# Patient Record
Sex: Female | Born: 1965 | Race: White | Hispanic: No | Marital: Married | Smoking: Never smoker
Health system: Southern US, Community
[De-identification: ages and names within clinical notes are randomized; demographics above are authoritative.]

## PROBLEM LIST (undated history)

## (undated) DIAGNOSIS — F319 Bipolar disorder, unspecified: Secondary | ICD-10-CM

## (undated) HISTORY — DX: Bipolar disorder, unspecified: F31.9

## (undated) HISTORY — PX: ABLATION: SHX5711

---

## 2004-08-17 ENCOUNTER — Ambulatory Visit: Payer: Self-pay | Admitting: Internal Medicine

## 2004-08-24 ENCOUNTER — Ambulatory Visit: Payer: Self-pay | Admitting: Pulmonary Disease

## 2004-09-04 ENCOUNTER — Ambulatory Visit (HOSPITAL_BASED_OUTPATIENT_CLINIC_OR_DEPARTMENT_OTHER): Admission: RE | Admit: 2004-09-04 | Discharge: 2004-09-04 | Payer: Self-pay | Admitting: Pulmonary Disease

## 2004-09-18 ENCOUNTER — Ambulatory Visit: Payer: Self-pay | Admitting: Pulmonary Disease

## 2004-09-26 ENCOUNTER — Ambulatory Visit: Payer: Self-pay | Admitting: Pulmonary Disease

## 2004-11-07 ENCOUNTER — Ambulatory Visit: Payer: Self-pay | Admitting: Internal Medicine

## 2004-11-09 ENCOUNTER — Ambulatory Visit: Payer: Self-pay | Admitting: Internal Medicine

## 2004-11-14 ENCOUNTER — Ambulatory Visit: Payer: Self-pay | Admitting: Internal Medicine

## 2005-10-05 ENCOUNTER — Ambulatory Visit: Payer: Self-pay | Admitting: Internal Medicine

## 2005-10-09 ENCOUNTER — Ambulatory Visit: Payer: Self-pay | Admitting: Internal Medicine

## 2006-03-13 ENCOUNTER — Ambulatory Visit: Payer: Self-pay | Admitting: *Deleted

## 2006-03-13 ENCOUNTER — Ambulatory Visit: Payer: Self-pay | Admitting: Internal Medicine

## 2008-06-19 ENCOUNTER — Ambulatory Visit: Payer: Self-pay | Admitting: Occupational Medicine

## 2008-06-19 DIAGNOSIS — F329 Major depressive disorder, single episode, unspecified: Secondary | ICD-10-CM | POA: Insufficient documentation

## 2009-12-08 ENCOUNTER — Ambulatory Visit: Payer: Self-pay | Admitting: Obstetrics & Gynecology

## 2009-12-08 LAB — CONVERTED CEMR LAB
Cholesterol: 276 mg/dL — ABNORMAL HIGH (ref 0–200)
Glucose, Bld: 97 mg/dL (ref 70–99)
Hemoglobin: 13.5 g/dL (ref 12.0–15.0)
MCHC: 33.2 g/dL (ref 30.0–36.0)
MCV: 88.9 fL (ref 78.0–100.0)
Platelets: 256 10*3/uL (ref 150–400)
RBC: 4.58 M/uL (ref 3.87–5.11)
RDW: 14 % (ref 11.5–15.5)
TSH: 0.919 microintl units/mL (ref 0.350–4.500)

## 2009-12-09 ENCOUNTER — Encounter: Admission: RE | Admit: 2009-12-09 | Discharge: 2009-12-09 | Payer: Self-pay | Admitting: Obstetrics & Gynecology

## 2009-12-14 ENCOUNTER — Encounter: Admission: RE | Admit: 2009-12-14 | Discharge: 2009-12-14 | Payer: Self-pay | Admitting: Obstetrics & Gynecology

## 2009-12-28 ENCOUNTER — Ambulatory Visit: Payer: Self-pay | Admitting: Obstetrics & Gynecology

## 2010-11-25 ENCOUNTER — Inpatient Hospital Stay (INDEPENDENT_AMBULATORY_CARE_PROVIDER_SITE_OTHER)
Admission: RE | Admit: 2010-11-25 | Discharge: 2010-11-25 | Disposition: A | Discharge status: Home / Self Care | Payer: BLUE CROSS/BLUE SHIELD | Source: Ambulatory Visit | Attending: Emergency Medicine | Admitting: Emergency Medicine

## 2010-11-25 ENCOUNTER — Encounter: Payer: Self-pay | Admitting: Emergency Medicine

## 2010-11-25 DIAGNOSIS — J029 Acute pharyngitis, unspecified: Secondary | ICD-10-CM

## 2010-11-25 LAB — CONVERTED CEMR LAB: Rapid Strep: NEGATIVE

## 2010-11-28 ENCOUNTER — Telehealth (INDEPENDENT_AMBULATORY_CARE_PROVIDER_SITE_OTHER): Payer: Self-pay | Admitting: *Deleted

## 2010-11-29 ENCOUNTER — Encounter: Payer: Self-pay | Admitting: Family Medicine

## 2010-11-29 ENCOUNTER — Ambulatory Visit (HOSPITAL_BASED_OUTPATIENT_CLINIC_OR_DEPARTMENT_OTHER)
Admission: RE | Admit: 2010-11-29 | Discharge: 2010-11-29 | Disposition: A | Discharge status: Home / Self Care | Payer: BLUE CROSS/BLUE SHIELD | Source: Ambulatory Visit | Attending: Family Medicine | Admitting: Family Medicine

## 2010-11-29 ENCOUNTER — Ambulatory Visit (INDEPENDENT_AMBULATORY_CARE_PROVIDER_SITE_OTHER): Payer: BLUE CROSS/BLUE SHIELD | Admitting: Family Medicine

## 2010-11-29 VITALS — BP 132/83 | HR 76 | Temp 98.6°F | Ht 69.0 in | Wt 223.2 lb

## 2010-11-29 DIAGNOSIS — M7730 Calcaneal spur, unspecified foot: Secondary | ICD-10-CM

## 2010-11-29 DIAGNOSIS — M79609 Pain in unspecified limb: Secondary | ICD-10-CM

## 2010-11-29 DIAGNOSIS — M79672 Pain in left foot: Secondary | ICD-10-CM

## 2010-11-29 NOTE — Assessment & Plan Note (Signed)
NAME:  JOAQUINA, NISSEN NO.:  [id number]   MEDICAL RECORD NO.:  [id number]          PATIENT TYPE:  POB   LOCATION:  [facility] at [place]         FACILITY:  [facility]   PHYSICIAN:  Allie Bossier, MD        DATE OF BIRTH:  March 20, 1966   DATE OF SERVICE:  12/28/2009                                  CLINIC NOTE   Ms. Figge is of a 45 year old married white G2, P2, she was seen here on  Dec 08, 2009 for her new patient annual exam.  At that time, she was  complaining of bleeding up to 14 days a month and as well as she now  tells me are very painful periods.  I did routine tests including a Pap  smear and mammogram which came back as normal.  On exam, I felt that her  uterus was approximately 8-week size.  The ultrasound measured the  uterus to be 10.6 x 4.8 x 5.5 cm with no particular fibroids, but a  possibility of adenomyosis.  Her TSH was normal and her hemoglobin was  13.5.  Please note that her lipids were abnormal and I am sending her to  a family practice doctor for further evaluation and treatment of this.  I have discussed with her and her husband treatment options of  dysfunctional uterine bleeding, I started with the most minimally  invasive, which would be hormone-mediated treatments.  I feel that she  is not a good candidate for Depo-Provera or birth control pills due to  her psychiatric condition of bipolar disease.   I do feel that Mirena would be an excellent treatment.  I have expressed  to her that she would certainly have irregular bleeding for the first 3  months.  I have also talked to her about hydrothermal ablation since  this has a 90% success rate.  I have also explained to her that it only  has a 40% amenorrhea rate.  I mentioned hysterectomy as a last choice  option.  I have given her handouts on the HTA and the Mirena and she and  her husband will review these choices and come back when they have made  a decision.      Allie Bossier, MD     MCD/MEDQ  D:  12/28/2009  T:  12/29/2009  Job:  [id number]

## 2010-11-29 NOTE — Patient Instructions (Signed)
Your x-rays were negative for a fracture. Your exam is consistent with a contusion of your 5th metatarsal and peroneal tendinopathy (inflammation/pain in the tendons that turn the foot outward). These should heal up over the next 3-5 weeks without a problem. Aleve 1-2 tabs twice a day with food x 7 days then as needed. Icing 15 minutes at a time 3-4 times a day. If swelling becomes an issue, elevation and using a compression bandage are recommended. If you are having trouble walking, can consider a hard-soled shoe or walking boot. If not improving after 4-5 weeks, would recommend reexamining you and considering physical therapy.

## 2010-11-29 NOTE — Assessment & Plan Note (Signed)
NAME:  Caitlyn Chase, DIRDEN NO.:  [id number]   MEDICAL RECORD NO.:  [id number]          PATIENT TYPE:  POB   LOCATION:  [facility] at [place]         FACILITY:  [facility]   PHYSICIAN:  Allie Bossier, MD        DATE OF BIRTH:  October 23, 1965   DATE OF SERVICE:  12/08/2009                                  CLINIC NOTE   HISTORY:  Caitlyn Chase is a 45 year old married white, gravida 2, para 2,  who comes in here for her new patient annual exam.  She has a 8-1/2-  year-old son and a 32 year old daughter and her main complaint is that  of her periods getting heavier over the last few years.  She says that  at this point, she will bleed up to 14 days a month and then may have  breakthrough bleeding until she has her next period approximately 2  weeks later.  She has been married for 20 years.  She reports a  decreased libido but denies dyspareunia.   PAST MEDICAL HISTORY:  Obesity.  She has bipolar and sees a psychiatric  nurse named, Ellis Savage, at [facility].  She has a  history of kidney stones, then she thinks they were calcium stones.   REVIEW OF SYSTEMS:  She has been married for 20 years.  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   PAST SURGICAL HISTORY:  Wisdom tooth extraction x4 with local.   ALLERGIES:  No latex allergies.  No known drug allergies.   SOCIAL HISTORY:  Negative for tobacco, alcohol, or drug use.   FAMILY HISTORY:  Negative for breast, GYN, and colon malignancies.   MEDICATIONS:  1. Fluoxetine 40-60 mg daily.  2. Lamotrigine 100 mg daily.  3. Seroquel XR 300 mg daily.  4. Ambien 10 mg at night.   REVIEW OF SYSTEMS:  She has not had a Pap smear for more than 10 years,  and she has never had a mammogram.   PHYSICAL EXAMINATION:  GENERAL:  Very pleasant and well-hydrated white  female, in no apparent distress.  VITAL SIGNS:  Height 5 feet 9 inches, weight 228 pounds, blood pressure  128/80, and pulse 99.  HEENT:  Normal.  BREASTS:  Normal bilaterally.  HEART:  Regular rate and rhythm.  LUNGS:  Clear to auscultation bilaterally.  ABDOMEN:  Obese and benign.  No palpable hepatosplenomegaly.  EXTERNAL GENITALIA:  She has a moderate amount of vulvar redness, but  she says there is no itch or discomfort.  She is a redhead of note.  Her  vaginal exam reveals a completely normal-appearing vaginal discharge and  cervix and vaginal vault.  Her uterus is approximately 8-week size,  anteverted and mobile, there are no adnexal masses, and is nontender.   ASSESSMENT AND PLAN:  1. Annual exam.  I have checked Pap smear.  We will schedule      mammogram.  Recommended self-breast and self-vulvar exams.  2. Menorrhagia.  I am checking a CBC, TSH, and GYN ultrasound to      evaluate her uterine enlargement.  3. Obesity.  Recommended weight loss, and I am  checking a fasting      lipid panel and a random blood sugar today.  She will follow up      when the ultrasound results are available.      Allie Bossier, MD     MCD/MEDQ  D:  12/08/2009  T:  12/09/2009  Job:  [id number]

## 2010-11-30 ENCOUNTER — Encounter: Payer: Self-pay | Admitting: Family Medicine

## 2010-11-30 DIAGNOSIS — M79672 Pain in left foot: Secondary | ICD-10-CM | POA: Insufficient documentation

## 2010-11-30 NOTE — Progress Notes (Signed)
  Subjective:    Patient ID: Caitlyn Chase, female    DOB: 1965-12-28, 45 y.o.   MRN: [id number]  HPI  45 yo F here for left foot pain.  Patient states while working at [employer] 3 weeks ago, rolled right ankle and believes had a pallet or something fall onto left foot. Right ankle has improved but pain continuing lateral left foot. No swelling or bruising. Not limping because of pain Not taking any medications for pain. Wearing supportive shoes. No prior pain or injuries to this area.  Past Medical History  Diagnosis Date  . Bipolar affective disorder     No current outpatient prescriptions on file prior to visit.    History reviewed. No pertinent past surgical history.  No Known Allergies  History   Social History  . Marital Status: Married    Spouse Name: N/A    Number of Children: N/A  . Years of Education: N/A   Occupational History  . Not on file.   Social History Main Topics  . Smoking status: Never Smoker   . Smokeless tobacco: Not on file  . Alcohol Use: Not on file  . Drug Use: Not on file  . Sexually Active: Not on file   Other Topics Concern  . Not on file   Social History Narrative  . No narrative on file    Family History  Problem Relation Age of Onset  . Hypertension Mother   . Heart attack Mother   . Diabetes Father     BP 132/83  Pulse 76  Temp(Src) 98.6 F (37 C) (Oral)  Ht 5\' 9"  (1.753 m)  Wt 223 lb 3.2 oz (101.243 kg)  BMI 32.96 kg/m2  Review of Systems See HPI above.    Objective:   Physical Exam Gen: NAD L foot: No gross deformity, swelling, or bruising. Mild TTP throughout 5th MT.  No other foot or ankle TTP. FROM ankle and toes.  Strength 5/5 but pain on resisted external rotation Negative ant drawer and talar tilt. NVI distally     Assessment & Plan:  1. Left foot pain - 2/2 contusion, small element of peroneal tendinopathy.  Offered boot, postop shoe but she declined - x-rays were  negative (would expect to be more symptomatic and to see fracture by this point if there was one) so do not feel it is necessary for her to use boot or postop shoe.  Nsaids, icing prn.  If not improving after 3-5 weeks, advised to return for follow-up.

## 2010-11-30 NOTE — Assessment & Plan Note (Signed)
2/2 contusion, small element of peroneal tendinopathy.  Offered boot, postop shoe but she declined - x-rays were negative (would expect to be more symptomatic and to see fracture by this point if there was one) so do not feel it is necessary for her to use boot or postop shoe.  Nsaids, icing prn.  If not improving after 3-5 weeks, advised to return for follow-up.

## 2010-12-02 NOTE — Procedures (Signed)
NAME:  Caitlyn Chase, Caitlyn Chase NO.:  [id number]   MEDICAL RECORD NO.:  [id number]          PATIENT TYPE:  OUT   LOCATION:  SLEEP CENTER                 FACILITY:  [facility]   PHYSICIAN:  Marcelyn Bruins, M.D. [facility] DATE OF BIRTH:  [birth year]   DATE OF STUDY:  09/04/2004                              NOCTURNAL POLYSOMNOGRAM   REFERRING PHYSICIAN:  Dr. Marcelyn Bruins   INDICATION FOR THE STUDY:  Hypersomnia with sleep apnea. Epworth score is 5.   SLEEP ARCHITECTURE:  The patient had a total sleep time of 360 minutes with  adequate slow wave sleep but very diminished REM. Sleep onset latency was  normal and REM latency was somewhat prolonged.   IMPRESSION:  1.  Mild to moderate obstructive sleep apnea/hypopnea syndrome with a      respiratory disturbance index of 18 events per hour and oxygen      desaturation as low as 81%. Events were clearly worse in the supine      position.  2.  Loud snoring noted throughout the study.  3.  No clinically significant cardiac arrhythmias.  4.  Large numbers of leg jerks with moderate sleep disruption. Clinical      correlation is suggested.      KC/MEDQ  D:  09/20/2004 15:31:38  T:  09/20/2004 21:31:59  Job:  [id number]

## 2011-01-22 ENCOUNTER — Emergency Department (HOSPITAL_COMMUNITY)
Admission: EM | Admit: 2011-01-22 | Discharge: 2011-01-23 | Disposition: A | Discharge status: Other Acute Inpatient Hospital | Payer: BC Managed Care – PPO | Attending: Emergency Medicine | Admitting: Emergency Medicine

## 2011-01-22 ENCOUNTER — Inpatient Hospital Stay (HOSPITAL_COMMUNITY): Admission: RE | Admit: 2011-01-22 | Payer: Self-pay | Source: Ambulatory Visit

## 2011-01-22 DIAGNOSIS — R45851 Suicidal ideations: Secondary | ICD-10-CM | POA: Insufficient documentation

## 2011-01-22 DIAGNOSIS — F3289 Other specified depressive episodes: Secondary | ICD-10-CM | POA: Insufficient documentation

## 2011-01-22 DIAGNOSIS — F329 Major depressive disorder, single episode, unspecified: Secondary | ICD-10-CM | POA: Insufficient documentation

## 2011-01-22 LAB — CBC
HCT: 37.9 % (ref 36.0–46.0)
MCHC: 34.3 g/dL (ref 30.0–36.0)
Platelets: 274 10*3/uL (ref 150–400)
RDW: 13.3 % (ref 11.5–15.5)

## 2011-01-22 LAB — URINALYSIS, ROUTINE W REFLEX MICROSCOPIC
Glucose, UA: NEGATIVE mg/dL
Hgb urine dipstick: NEGATIVE
Ketones, ur: NEGATIVE mg/dL
Leukocytes, UA: NEGATIVE
Protein, ur: NEGATIVE mg/dL
Specific Gravity, Urine: 1.026 (ref 1.005–1.030)
Urobilinogen, UA: 0.2 mg/dL (ref 0.0–1.0)
pH: 6 (ref 5.0–8.0)

## 2011-01-22 LAB — RAPID URINE DRUG SCREEN, HOSP PERFORMED
Amphetamines: POSITIVE — AB
Barbiturates: NOT DETECTED
Benzodiazepines: POSITIVE — AB
Cocaine: NOT DETECTED
Tetrahydrocannabinol: NOT DETECTED

## 2011-01-22 LAB — BASIC METABOLIC PANEL
Calcium: 9.4 mg/dL (ref 8.4–10.5)
Chloride: 99 mEq/L (ref 96–112)
Creatinine, Ser: 0.86 mg/dL (ref 0.50–1.10)
GFR calc non Af Amer: >60 mL/min (ref >60)
Potassium: 4.5 mEq/L (ref 3.5–5.1)
Sodium: 136 mEq/L (ref 135–145)

## 2011-01-22 LAB — DIFFERENTIAL
Basophils Absolute: 0.1 10*3/uL (ref 0.0–0.1)
Eosinophils Absolute: 0.2 10*3/uL (ref 0.0–0.7)
Eosinophils Relative: 2 % (ref 0–5)

## 2011-01-22 LAB — ETHANOL: Alcohol, Ethyl (B): <11 mg/dL (ref 0–11)

## 2011-01-23 ENCOUNTER — Inpatient Hospital Stay (HOSPITAL_COMMUNITY)
Admission: AD | Admit: 2011-01-23 | Discharge: 2011-01-24 | DRG: 430 | Disposition: A | Discharge status: Home / Self Care | Payer: BC Managed Care – PPO | Source: Ambulatory Visit | Attending: Psychiatry | Admitting: Psychiatry

## 2011-01-23 DIAGNOSIS — F3160 Bipolar disorder, current episode mixed, unspecified: Principal | ICD-10-CM

## 2011-01-23 DIAGNOSIS — T426X1A Poisoning by other antiepileptic and sedative-hypnotic drugs, accidental (unintentional), initial encounter: Secondary | ICD-10-CM

## 2011-01-23 LAB — HEPATIC FUNCTION PANEL
Albumin: 4.3 g/dL (ref 3.5–5.2)
Total Protein: 7.5 g/dL (ref 6.0–8.3)

## 2011-01-24 DIAGNOSIS — F3189 Other bipolar disorder: Secondary | ICD-10-CM

## 2011-01-25 NOTE — Assessment & Plan Note (Signed)
NAME:  [name], [name] NO.:  [id number]  MEDICAL RECORD NO.:  [id number]  LOCATION:  0502                          [facility]ACILITY:  [facility]  PHYSICIAN:  Franchot Gallo, MD     DATE OF BIRTH:  March 06, 1966  DATE OF ADMISSION:  01/23/2011 DATE OF DISCHARGE:  01/24/2011                      PSYCHIATRIC ADMISSION ASSESSMENT   CHIEF COMPLAINT:  "I really don't know why I am here."  HISTORY OF PRESENT ILLNESS:  Caitlyn Chase is a 45 year old married white female who was admitted to [facility] last evening under involuntary holding order after she allegedly voiced suicidal ideations.  On interview, the patient denied any thoughts of harming herself or others.  She states that she does have a history of mood swings and has Caitlyn irritability recently.  She reports that last evening, she accidentally took 3 Ambien rather than 1 Ambien and 2 Klonopin, which are Caitlyn prescribed nighttime doses.  She states that after taking the Ambien, she became concerned and looked up the "lethal dose of Ambien" on the internet.  She states she discovered that the 3 Ambien she took would not harm Caitlyn and she went to bed.  She reports that later, Caitlyn Chase noticed the search of the lethal dose of Ambien and attempted to wake Caitlyn up and she was "groggy."  She reports that Caitlyn Chase then brought Caitlyn to [facility] for evaluation, and she was taken to [facility] for evaluation of a possible overdose.  On interview, the patient states that she is seen by a local psychiatrist, Dr. Lance Coon, for Caitlyn bipolar disorder.  She states that Caitlyn medications recently have been "tweaked" and, as a result, she has had Caitlyn mild mood swings and irritability.  She, however, states that she had never, at any point in time, felt like harming herself or others.  Currently, the patient states that she is sleeping reasonably well and reports a good appetite.  She reports Caitlyn episodic mild feelings  of sadness, anhedonia, and depressed mood, but again denies any suicidal or homicidal ideations.  She also denies any past attempts or gestures.  The patient also reports to experiencing Caitlyn "mood swings in the past," but prior to treatment, she would up for days at a time and start multiple projects.  She states that she first received care for Caitlyn symptoms approximately 12 years ago, and Caitlyn symptoms have been under good control since.  The patient denies any past/current auditory or visual hallucinations or delusional thinking.  She denies any use of alcohol, tobacco products, or illicit drugs.  The patient states today that she does not feel she needs to be inpatient and would prefer to leave today.  The patient's Chase was contacted, who also agreed that there were no safety concerns.  The patient will be discharged to outpatient followup and removed from Caitlyn holding order.  PAST MEDICAL HISTORY  CURRENT MEDICATIONS: 1. Lamictal 100 mg p.o. q.a.m. 2. Vyvanse 60 mg p.o. q.a.m. 3. Klonopin 0.5 mg tablets 1 to 2 tablets p.o. every day p.r.n. for     irritability. 4. Ambien CR 12.5 mg p.o. nightly or Ambien 10 mg p.o. nightly for  sleep. 5. Abilify 5 mg p.o. q.a.m. for mood stabilization.  ALLERGIES:  NKDA.  MEDICAL ILLNESSES:  None reported.  PAST OPERATIONS:  None reported.  PAST PSYCHIATRIC HISTORY:  As stated above, the patient sees Dr. Lance Coon, at [facility], and has been treated by Caitlyn for 3 to 4 years.  She denies any past psychiatric hospitalizations but states that she was first seen for psychiatric care when she was 45 years of age.  FAMILY HISTORY:  The patient states that Caitlyn family is adopted, but that she knows Caitlyn Chase and Caitlyn Chase have depression as well as alcohol and drug issues.  SOCIAL HISTORY:  The patient states that she was born and raised in the [place], [place] area and currently lives with Caitlyn  Chase of 20 years and their 2 children, a son 65 years of age who is autistic and a daughter 61 years of age.  The patient completed Caitlyn BA in school and is currently working to be a IT consultant.  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  As stated, the patient denies any use of alcohol, illicit drugs, or tobacco products.  MENTAL STATUS EXAM:  General:  The patient was alert and oriented x3 and was friendly and cooperative with this provider.  Speech was appropriate in terms of rate and volume and no pressuring noted.  Mood appeared mildly depressed.  Affect was slightly constricted.  Thoughts:  The patient adamantly denied any suicidal or homicidal ideations nor does she report any auditory or visual hallucinations or delusional thinking. Judgment and insight both appeared good.  IMPRESSION:   Axis I:  Bipolar I disorder, mixed, currently under fair to good control. Axis II:  Deferred. Axis III:  None reported. Axis IV:  Chronic mental illness. Axis V:  Global assessment of functioning at time of admission approximately 70.  Highest global assessment of functioning in past year also 70.  PLAN: 1. The patient was continued on Caitlyn above psychiatric medications, as     previously prescribed.  No medication changes were made. 2. The patient was released from Caitlyn involuntary holding order and     allowed to sign for voluntary care. 3. The patient was discharged to outpatient followup and is scheduled     to see a therapist at [facility] on February 01, 2011 at     8:30 a.m., as well as Ellis Savage at [facility] on January 31, 2011 at 1 p.m.    ___________________________________ Franchot Gallo, MD    RR/MEDQ  D:  01/24/2011  T:  01/25/2011  Job:  [id number]  Electronically Signed by Franchot Gallo MD on 01/25/2011 12:30:10 PM

## 2011-01-25 NOTE — Discharge Summary (Signed)
NAMECORAL, TIMME NO.:  [id number]  MEDICAL RECORD NO.:  [id number]  LOCATION:  0502                          [facility]ACILITY:  [facility]  PHYSICIAN:  Franchot Gallo, MD     DATE OF BIRTH:  [birth year]  DATE OF ADMISSION:  01/23/2011 DATE OF DISCHARGE:  01/24/2011                              DISCHARGE SUMMARY   SUMMARY OF WHY ADMITTED:  The patient was admitted to [facility] after the patient accidentally ingested 3 Ambien instead of 1 Ambien and 2 Klonopin at bedtime as prescribed.  The patient states that this was a mistake and she became concerned and checked on the internet for the lethal dose of Ambien.  When she found that the medication was not harmful to her at the dose taken, she states she went to sleep, but her husband later attempted to wake her up after seeing the search online. She states that she was groggy and the patient's husband became concerned that she may have overdosed and brought her to [facility] for evaluation.  The patient was then sent to [facility] under an involuntary commitment order.  SUMMARY OF HOSPITALIZATION:  Upon interview, the patient adamantly denied any thoughts of harming herself or others.  She stated that she had accidentally taking the medication as reported above, but at no time was considering harming herself or others.  The patient states that she has no history of any suicide attempts or gestures, but did have a history of a bipolar disorder, which was under fair-to-good control. The patient asked to continue on her medications as prescribed by her outpatient provider and requested to be discharged.  On interview, the patient reported some mild feelings of sadness, anhedonia, and depressed mood, but stated that she was sleeping well and reported a good appetite.  She adamantly denied any suicidal or homicidal ideations.  She also denied any auditory or visual hallucinations or delusional thinking or any  recent prolonged manic or hypomanic symptoms.  The patient's husband was also contacted and he did not have any safety concerns.  He reiterated the history given that the patient had accidentally taking 3 Ambien, but he did not feel that she was a danger to self or others.  The patient was discharged per her request.  SIGNIFICANT LABORATORIES:  Urine drug screen on January 22, 2011 was positive for amphetamines and benzodiazepines.  Of note, the patient is taking Vyvanse, which is form of Adderall, as well as Klonopin.  The patient's glucose level was 124.  The patient's WBC count was 10.9. Urine pregnancy was negative.  DISCHARGE MEDICATIONS:   (These are medications the patient reports that she is taking.) 1. Lamictal 100 mg p.o. every a.m. 2. Vyvanse 60 mg p.o. every a.m. 3. Klonopin 0.5 mg tablets 1 to 2 tablets p.o. daily p.r.n. for     irritability. 4. Abilify 5 mg p.o. every a.m. 5. Ambien CR 12.5 mg p.o. at bedtime or Ambien 10 mg p.o. at bedtime     for sleep.  DISCHARGE DIAGNOSES:   Axis I:  Bipolar I disorder, mixed, currently under fair-to-good control. Axis II:  Deferred. Axis III:  No acute illnesses reported.  Axis IV:  Chronic serious mental illness. Axis V:  Global assessment of functioning at time of admission approximately 55.  Global assessment of functioning at time of discharge approximately 60.  Highest global assessment of functioning in past year approximately 65.  CONDITION ON DISCHARGE:  The patient was alert and oriented x3 and friendly and cooperative with this provider.  Speech was appropriate in terms of rate and volume and no pressuring noted.  Mood appeared mildly depressed.  Affect was mildly constricted.  Thoughts, the patient adamantly denied any suicidal or homicidal ideations.  She also denied any auditory or visual hallucinations or delusional thinking.  Judgment and insight both appeared good.  The patient's anxiety was under fair to good  control and there were no medication-related side effects.  FOLLOWUP INSTRUCTIONS:  The patient is to followup with [facility] the her provider, Caprice Kluver, nurse practitioner, on January 31, 2011, 1:00 p.m.  She is also to followup with [facility] on February 01, 2011 and 8:30 a.m. to meet with "Tasia Catchings."    __________________________________ Franchot Gallo, MD     RR/MEDQ  D:  01/24/2011  T:  01/25/2011  Job:  [id number]  Electronically Signed by Franchot Gallo MD on 01/25/2011 12:28:48 PM

## 2011-02-01 ENCOUNTER — Ambulatory Visit (HOSPITAL_COMMUNITY): Payer: BLUE CROSS/BLUE SHIELD | Admitting: Behavioral Health

## 2011-06-19 NOTE — Progress Notes (Signed)
Summary: TEST FOR STREP THROAT rm 4   Vital Signs:  Patient Profile:   45 Years Old Female CC:      hoarseness x 1wk Height:     69 inches Weight:      223.75 pounds O2 Sat:      96 % O2 treatment:    Room Air Temp:     98.2 degrees F oral Pulse rate:   84 / minute Resp:     18 per minute BP sitting:   131 / 85  (left arm) Cuff size:   regular  Vitals Entered By: Clemens Catholic LPN (Nov 25, 2010 12:04 PM)                  Updated Prior Medication List: SEROQUEL XR 300 MG XR24H-TAB (QUETIAPINE FUMARATE) 1 tab by mouth qd AMBIEN 5 MG TABS (ZOLPIDEM TARTRATE)  ABILIFY 5 MG TABS (ARIPIPRAZOLE)  LAMICTAL 150 MG TABS (LAMOTRIGINE)   Current Allergies (reviewed today): No known allergies History of Present Illness History from: patient Chief Complaint: hoarseness x 1wk History of Present Illness: 45 Years Old Female complains of onset of cold symptoms for a few days.  Caitlyn Chase has been using no OTC meds.  She is just here to get tested for strep throat because both her kids are + and are being treated currently. + mild sore throat + mild hoarseness No cough No pleuritic pain No wheezing No nasal congestion + post-nasal drainage No sinus pain/pressure No chest congestion No itchy/red eyes No earache No hemoptysis No SOB No chills/sweats No fever No nausea No vomiting No abdominal pain No diarrhea No skin rashes No fatigue No myalgias No headache   REVIEW OF SYSTEMS Constitutional Symptoms      Denies fever, chills, night sweats, weight loss, weight gain, and fatigue.  Eyes       Denies change in vision, eye pain, eye discharge, glasses, contact lenses, and eye surgery. Ear/Nose/Throat/Mouth       Complains of hoarseness.      Denies hearing loss/aids, change in hearing, ear pain, ear discharge, dizziness, frequent runny nose, frequent nose bleeds, sinus problems, sore throat, and tooth pain or bleeding.  Respiratory       Denies dry cough, productive cough,  wheezing, shortness of breath, asthma, bronchitis, and emphysema/COPD.  Cardiovascular       Denies murmurs, chest pain, and tires easily with exhertion.    Gastrointestinal       Denies stomach pain, nausea/vomiting, diarrhea, constipation, blood in bowel movements, and indigestion. Genitourniary       Denies painful urination, kidney stones, and loss of urinary control. Neurological       Denies paralysis, seizures, and fainting/blackouts. Musculoskeletal       Denies muscle pain, joint pain, joint stiffness, decreased range of motion, redness, swelling, muscle weakness, and gout.  Skin       Denies bruising, unusual mles/lumps or sores, and hair/skin or nail changes.  Psych       Denies mood changes, temper/anger issues, anxiety/stress, speech problems, depression, and sleep problems. Other Comments: pt c/o hoarseness x 1wk. both of her children have tested positive for strep this wk. she does not have a sore throat but she request to be tested for strep.  no fever. she has taken IBF.   Past History:  Past Medical History: Reviewed history from 06/19/2008 and no changes required. Depression BiPolar  Past Surgical History: wisdom teeth extracted  Family History: father-Family History Diabetes 1st degree  relative/pancreatic CA mother- Family History Hypertension  Social History: Never Smoked Alcohol use-no Drug use-no Smoking Status:  never Drug Use:  no Physical Exam General appearance: well developed, well nourished, no acute distress Ears: normal, no lesions or deformities Nasal: mucosa pink, nonedematous, no septal deviation, turbinates normal Oral/Pharynx: mild clear PND, no erythema, no exudates, no swelling Chest/Lungs: no rales, wheezes, or rhonchi bilateral, breath sounds equal without effort Heart: regular rate and  rhythm, no murmur MSE: oriented to time, place, and person Assessment New Problems: ACUTE PHARYNGITIS (ICD-462)   Plan New Orders: Est.  Patient Level II [78469] T-Culture, Throat [62952-84132] Rapid Strep [44010] Planning Comments:   1)  No antibiotic given since rapid strep negative.  Culture is pending. 2)  Use nasal saline solution (over the counter) at least 3 times a day. 3)  Use over the counter decongestants like Zyrtec-D every 12 hours as needed to help with congestion. 4)  Can take tylenol every 6 hours or motrin every 8 hours for pain or fever. 5)  Follow up with your primary doctor  if no improvement in 5-7 days, sooner if increasing pain, fever, or new symptoms.    The patient and/or caregiver has been counseled thoroughly with regard to medications prescribed including dosage, schedule, interactions, rationale for use, and possible side effects and they verbalize understanding.  Diagnoses and expected course of recovery discussed and will return if not improved as expected or if the condition worsens. Patient and/or caregiver verbalized understanding.   Orders Added: 1)  Est. Patient Level II [27253] 2)  T-Culture, Throat [66440-34742] 3)  Rapid Strep [59563]    Laboratory Results  Date/Time Received: Nov 25, 2010 12:09 PM Date/Time Reported: Nov 25, 2010 12:09 PM   Other Tests  Rapid Strep: negative  Kit Test Internal QC: Negative   (Normal Range: Negative)

## 2011-06-19 NOTE — Telephone Encounter (Signed)
  Phone Note Outgoing Call   Call placed by: Clemens Catholic LPN,  Nov 28, 2010 9:29 AM Call placed to: Patient Summary of Call: call back: left message with culture results and to call back if she has any questions or concerns. Initial call taken by: Clemens Catholic LPN,  Nov 28, 2010 9:29 AM

## 2013-05-07 DIAGNOSIS — G4700 Insomnia, unspecified: Secondary | ICD-10-CM | POA: Insufficient documentation

## 2013-05-07 DIAGNOSIS — F319 Bipolar disorder, unspecified: Secondary | ICD-10-CM | POA: Insufficient documentation

## 2013-05-07 DIAGNOSIS — F429 Obsessive-compulsive disorder, unspecified: Secondary | ICD-10-CM | POA: Insufficient documentation

## 2013-05-07 DIAGNOSIS — F988 Other specified behavioral and emotional disorders with onset usually occurring in childhood and adolescence: Secondary | ICD-10-CM | POA: Insufficient documentation

## 2014-03-03 ENCOUNTER — Emergency Department (INDEPENDENT_AMBULATORY_CARE_PROVIDER_SITE_OTHER)
Admission: EM | Admit: 2014-03-03 | Discharge: 2014-03-03 | Disposition: A | Discharge status: Home / Self Care | Payer: 59 | Source: Home / Self Care | Attending: Emergency Medicine | Admitting: Emergency Medicine

## 2014-03-03 ENCOUNTER — Encounter: Payer: Self-pay | Admitting: Emergency Medicine

## 2014-03-03 DIAGNOSIS — L237 Allergic contact dermatitis due to plants, except food: Secondary | ICD-10-CM

## 2014-03-03 DIAGNOSIS — L255 Unspecified contact dermatitis due to plants, except food: Secondary | ICD-10-CM

## 2014-03-03 MED ORDER — PREDNISONE (PAK) 10 MG PO TABS: ORAL_TABLET | ORAL | Status: DC

## 2014-03-03 MED ORDER — BETAMETHASONE DIPROPIONATE 0.05 % EX CREA: TOPICAL_CREAM | Freq: Two times a day (BID) | CUTANEOUS | Status: DC

## 2014-03-03 NOTE — ED Provider Notes (Signed)
CSN: [id number]     Arrival date & time 03/03/14  1817 History   First MD Initiated Contact with Patient 03/03/14 1820     Chief Complaint  Patient presents with  . Rash   (Consider location/radiation/quality/duration/timing/severity/associated sxs/prior Treatment) HPI Pt c/o rash on her arms and legs bilaterally x 1 wk. Was exposed to poison ivy working in the yard the day before the rash started, now it's progressively worsening. Pruritic. Some vesicles have clear drainage at times No bleeding or pus or discolored drainage. No fever or chills or systemic symptoms Past Medical History  Diagnosis Date  . Bipolar affective disorder    History reviewed. No pertinent past surgical history. Family History  Problem Relation Age of Onset  . Hypertension Mother   . Heart attack Mother   . Diabetes Father   . Cancer Father     pancreatic   History  Substance Use Topics  . Smoking status: Never Smoker   . Smokeless tobacco: Not on file  . Alcohol Use: No   OB History   Grav Para Term Preterm Abortions TAB SAB Ect Mult Living                 Review of Systems  All other systems reviewed and are negative.   Allergies  Review of patient's allergies indicates no known allergies.  Home Medications   Prior to Admission medications   Medication Sig Start Date End Date Taking? Authorizing Provider  amphetamine-dextroamphetamine (ADDERALL XR) 20 MG 24 hr capsule Take 20 mg by mouth daily.   Yes Historical Provider, MD  LamoTRIgine (LAMICTAL XR) 250 MG TB24 Take 1 tablet by mouth daily.     Yes Historical Provider, MD  QUEtiapine (SEROQUEL XR) 200 MG 24 hr tablet Take 200 mg by mouth at bedtime.     Yes Historical Provider, MD  ARIPiprazole (ABILIFY) 5 MG tablet Take 5 mg by mouth daily.      Historical Provider, MD  betamethasone dipropionate (DIPROLENE) 0.05 % cream Apply topically 2 (two) times daily. 03/03/14   Lajean Manesavid Massey, MD  predniSONE (STERAPRED UNI-PAK) 10 MG tablet Take  as directed for 6 days.--Take 6 on day 1, 5 on day 2, 4 on day 3, then 3 tablets on day 4, then 2 tablets on day 5, then 1 on day 6. 03/03/14   Lajean Manesavid Massey, MD  zolpidem (AMBIEN) 5 MG tablet Take 5 mg by mouth at bedtime as needed.      Historical Provider, MD   BP 130/79  Pulse 81  Temp(Src) 98.4 F (36.9 C) (Oral)  Resp 16  Ht 5\' 9"  (1.753 m)  Wt 173 lb (78.472 kg)  BMI 25.54 kg/m2  SpO2 100% Physical Exam  Nursing note and vitals reviewed. Constitutional: She is oriented to person, place, and time. She appears well-developed and well-nourished. No distress.  HENT:  Head: Normocephalic and atraumatic.  Eyes: Conjunctivae and EOM are normal. Pupils are equal, round, and reactive to light. No scleral icterus.  Neck: Normal range of motion.  Cardiovascular: Normal rate.   Pulmonary/Chest: Effort normal.  Abdominal: She exhibits no distension.  Musculoskeletal: Normal range of motion.  Neurological: She is alert and oriented to person, place, and time.  Skin: Skin is warm.  Diffuse, Scattered poison ivy rash, erythematous with scattered vesicular papules on both arms legs and trunk and a few on neck  Psychiatric: She has a normal mood and affect.    ED Course  Procedures (including critical care time)  Labs Review Labs Reviewed - No data to display  Imaging Review No results found.   MDM   1. Poison ivy dermatitis    Treatment options discussed, as well as risks, benefits, alternatives. Patient voiced understanding and agreement with the following plans: Prednisone burst orally x6 days Betamethasone cream Other measures discussed Follow-up with your primary care doctor or dermatologist in 5-7 days if not improving, or sooner if symptoms become worse. Precautions discussed. Red flags discussed. Questions invited and answered. Patient voiced understanding and agreement.     Lajean Manes, MD 03/03/14 [phone]

## 2014-03-03 NOTE — ED Notes (Signed)
Pt c/o rash on her arms and legs bilaterally x 1 wk.

## 2014-10-27 ENCOUNTER — Encounter: Payer: Self-pay | Admitting: *Deleted

## 2014-10-27 ENCOUNTER — Emergency Department (INDEPENDENT_AMBULATORY_CARE_PROVIDER_SITE_OTHER)
Admission: EM | Admit: 2014-10-27 | Discharge: 2014-10-27 | Disposition: A | Discharge status: Home / Self Care | Payer: 59 | Source: Home / Self Care | Attending: Emergency Medicine | Admitting: Emergency Medicine

## 2014-10-27 DIAGNOSIS — K2970 Gastritis, unspecified, without bleeding: Secondary | ICD-10-CM | POA: Diagnosis not present

## 2014-10-27 DIAGNOSIS — K2990 Gastroduodenitis, unspecified, without bleeding: Secondary | ICD-10-CM

## 2014-10-27 LAB — POCT CBC W AUTO DIFF (K'VILLE URGENT CARE)

## 2014-10-27 MED ORDER — ONDANSETRON HCL 4 MG PO TABS: 4.0000 mg | ORAL_TABLET | Freq: Four times a day (QID) | ORAL | Status: DC

## 2014-10-27 MED ORDER — ESOMEPRAZOLE MAGNESIUM 40 MG PO CPDR: 40.0000 mg | DELAYED_RELEASE_CAPSULE | Freq: Every day | ORAL | Status: AC

## 2014-10-27 MED ORDER — DICYCLOMINE HCL 20 MG PO TABS: 20.0000 mg | ORAL_TABLET | Freq: Two times a day (BID) | ORAL | Status: DC

## 2014-10-27 NOTE — ED Notes (Signed)
Pt c/o sharp mid abd pain intermittently with nausea x 4 days. Denies fever. Taking prilosec x 4 days with no relief.

## 2014-10-27 NOTE — ED Provider Notes (Signed)
CSN: [id number]     Arrival date & time 10/27/14  1611 History   First MD Initiated Contact with Patient 10/27/14 1636    [facility] Chief Complaint  Patient presents with  . Abdominal Pain    HPI Onset 4 days ago. Intermittent episodes of epigastric and left upper quadrant discomfort associated with nausea but no vomiting. It seems to come and go, she is not sure if it's related to food. She admits she's been under a great deal of stress recently which may have contributed to this. Appetite is okay. She is tolerating liquids and solids without vomiting. She describes the epigastric pain as tight, sharp, "like a knot". It occurs every 1 or 2 hours and then resolves after about 2 minutes. Maximum intensity 5 out of 10, minimum intensity 1 out of 10. Currently 3 out of 10. She's tried Prilosec, and she thinks it might have helped a little bit. Tried Tums without significant immediate help. She states she ate at [facility] at 2:30 PM today and epigastric pain increased after that, so she decided to come here to [facility] for further evaluation and treatment. She describes that many years ago she was diagnosed with some type of gastritis and was on Prilosec back then.  Associated symptoms: Denies right upper quadrant or any lower abdominal or pelvic pain. No fever or chills. No diarrhea or change of bowel habits. No melena or bright blood per rectum. No urinary symptoms. No dysuria or frequency or hematuria. No vaginal discharge or bleeding. She denies chance pregnancy, as she has had uterine ablation years ago, no periods for years. She denies drinking alcohol. Denies using NSAIDs regularly.  Remainder of Review of Systems negative for acute change except as noted in the HPI.  Past Medical History  Diagnosis Date  . Bipolar affective disorder    History reviewed. No pertinent past surgical history. Family History  Problem Relation Age of Onset  . Hypertension  Mother   . Heart attack Mother   . Diabetes Father   . Cancer Father     pancreatic   History  Substance Use Topics  . Smoking status: Never Smoker   . Smokeless tobacco: Not on file  . Alcohol Use: No   OB History    No data available     Review of Systems  Allergies  Review of patient's allergies indicates no known allergies.  Home Medications   Prior to Admission medications   Medication Sig Start Date End Date Taking? Authorizing Provider  amphetamine-dextroamphetamine (ADDERALL XR) 20 MG 24 hr capsule Take 20 mg by mouth daily.   Yes Historical Provider, MD  LamoTRIgine (LAMICTAL XR) 250 MG TB24 Take 1 tablet by mouth daily.     Yes Historical Provider, MD  omeprazole (PRILOSEC) 20 MG capsule Take 20 mg by mouth daily.   Yes Historical Provider, MD  QUEtiapine (SEROQUEL XR) 200 MG 24 hr tablet Take 200 mg by mouth at bedtime.     Yes Historical Provider, MD  dicyclomine (BENTYL) 20 MG tablet Take 1 tablet (20 mg total) by mouth 2 (two) times daily. As needed for abdominal pain 10/27/14   Lajean Manes, MD  esomeprazole (NEXIUM) 40 MG capsule Take 1 capsule (40 mg total) by mouth daily. 10/27/14   Lajean Manes, MD  zolpidem (AMBIEN) 5 MG tablet Take 5 mg by mouth at bedtime as needed.      Historical Provider, MD   BP 138/81 mmHg  Pulse 75  Temp(Src) 98.2 F (36.8 C) (Oral)  Resp 16  Ht 5\' 9"  (1.753 m)  Wt 187 lb (84.823 kg)  BMI 27.60 kg/m2  SpO2 100% Physical Exam  Constitutional: She is oriented to person, place, and time. She appears well-developed and well-nourished. No distress.  HENT:  Head: Normocephalic and atraumatic.  Mouth/Throat: Oropharynx is clear and moist.  Eyes: Conjunctivae and EOM are normal. Pupils are equal, round, and reactive to light. No scleral icterus.  Neck: Normal range of motion. Neck supple. No JVD present. No tracheal deviation present.  Cardiovascular: Normal rate, regular rhythm and normal heart sounds.   Pulmonary/Chest: Effort  normal and breath sounds normal. No respiratory distress. She has no wheezes. She has no rales.  Abdominal: Soft. Bowel sounds are normal. She exhibits no distension, no abdominal bruit, no ascites, no pulsatile midline mass and no mass. There is no hepatosplenomegaly. There is tenderness (Mild) in the epigastric area. There is no rigidity, no rebound, no guarding, no CVA tenderness, no tenderness at McBurney's point and negative Murphy's sign. No hernia.  Musculoskeletal: Normal range of motion.  Lymphadenopathy:    She has no cervical adenopathy.  Neurological: She is alert and oriented to person, place, and time. No cranial nerve deficit or sensory deficit.  Skin: Skin is warm. No rash noted. She is not diaphoretic.  Psychiatric: She has a normal mood and affect.  Nursing note and vitals reviewed.   ED Course  Procedures (including critical care time) Labs Review Labs Reviewed  COMPREHENSIVE METABOLIC PANEL  AMYLASE  LIPASE  POCT CBC W AUTO DIFF ([facility])    Imaging Review No results found.   MDM   1. Gastritis and gastroduodenitis    CBC: WBC upper limits of normal 11.5 Hemoglobin normal 12.7 Platelets normal 233,000  Clinically, no evidence of acute abdomen. She likely has gastritis or duodenitis. Possible she could have an early gastric or peptic ulcer. Hemoglobin normal. No history or evidence of GI bleed. Also in the differential could be atypical cholecystitis, gallstone, pancreatitis, or other causes.  Workup and treatment options discussed at length. Will send off blood tests for CMP, amylase, lipase to reference lab. She declined any imaging today. We discussed pushing clear liquids, advance to bland food and progress diet as tolerated. I prescribed Nexium 40 mg daily, Bentyl 20 mg twice a day as needed for abdominal pain, and Zofran 4 mg every 6 hours if needed for nausea. Follow-up with your GI or PCP 2-3 days if not improving, or sooner if symptoms  become worse. Precautions discussed. Red flags discussed.--Emergency room if any red flag Questions invited and answered. Patient voiced understanding and agreement.      Lajean Manesavid Massey, MD 10/27/14 [phone]

## 2014-10-28 ENCOUNTER — Telehealth: Payer: Self-pay | Admitting: *Deleted

## 2014-10-28 LAB — COMPREHENSIVE METABOLIC PANEL
ALT: 17 U/L (ref 0–35)
AST: 18 U/L (ref 0–37)
Albumin: 3.9 g/dL (ref 3.5–5.2)
Alkaline Phosphatase: 37 U/L — ABNORMAL LOW (ref 39–117)
BUN: 18 mg/dL (ref 6–23)
CO2: 32 mEq/L (ref 19–32)
Calcium: 8.8 mg/dL (ref 8.4–10.5)
Chloride: 102 mEq/L (ref 96–112)
Creat: 0.8 mg/dL (ref 0.50–1.10)
Glucose, Bld: 87 mg/dL (ref 70–99)
Potassium: 4.3 mEq/L (ref 3.5–5.3)
Sodium: 139 mEq/L (ref 135–145)
Total Bilirubin: 0.4 mg/dL (ref 0.2–1.2)
Total Protein: 6.6 g/dL (ref 6.0–8.3)

## 2014-10-28 LAB — AMYLASE: Amylase: 17 U/L (ref 0–105)

## 2014-10-28 LAB — LIPASE: Lipase: 20 U/L (ref 0–75)

## 2014-11-17 ENCOUNTER — Emergency Department (INDEPENDENT_AMBULATORY_CARE_PROVIDER_SITE_OTHER)
Admission: EM | Admit: 2014-11-17 | Discharge: 2014-11-17 | Disposition: A | Discharge status: Home / Self Care | Payer: 59 | Source: Home / Self Care | Attending: Emergency Medicine | Admitting: Emergency Medicine

## 2014-11-17 ENCOUNTER — Encounter: Payer: Self-pay | Admitting: *Deleted

## 2014-11-17 DIAGNOSIS — S161XXA Strain of muscle, fascia and tendon at neck level, initial encounter: Secondary | ICD-10-CM

## 2014-11-17 MED ORDER — MELOXICAM 7.5 MG PO TABS: ORAL_TABLET | ORAL | Status: AC

## 2014-11-17 MED ORDER — PREDNISONE 50 MG PO TABS: 50.0000 mg | ORAL_TABLET | Freq: Every day | ORAL | Status: DC

## 2014-11-17 NOTE — ED Notes (Signed)
Pt c/o upper back pain x 1 week. Pain radiates to left shoulder at times. Limited ROM.

## 2014-11-17 NOTE — ED Provider Notes (Signed)
CSN: [id number]     Arrival date & time 11/17/14  1427 History   First MD Initiated Contact with Patient 11/17/14 1444     Chief Complaint  Patient presents with  . Neck Pain   Patient is a 49 y.o. female presenting with back pain. The history is provided by the patient.  Back Pain Pain location: Posterior neck/cervical pain. Quality:  Aching Radiates to:  L shoulder Pain severity:  Mild Pain is:  Unable to specify Onset quality:  Unable to specify Duration:  7 days Timing:  Intermittent Progression:  Waxing and waning Chronicity:  New Context comment:  Recalls no specific injury, but she did some repetitive motions week ago Relieved by:  Being still Worsened by:  Bending Ineffective treatments: Ibuprofen. Associated symptoms: no abdominal pain, no bowel incontinence, no chest pain, no dysuria, no fever, no headaches, no leg pain, no numbness and no weakness   Risk factors: no hx of osteoporosis    pain intensity is 3 out of 10  Past Medical History  Diagnosis Date  . Bipolar affective disorder    Past Surgical History  Procedure Laterality Date  . Ablation     Family History  Problem Relation Age of Onset  . Hypertension Mother   . Heart attack Mother   . Diabetes Father   . Cancer Father     pancreatic   History  Substance Use Topics  . Smoking status: Never Smoker   . Smokeless tobacco: Not on file  . Alcohol Use: No   OB History    No data available     Review of Systems  Constitutional: Negative for fever.  Cardiovascular: Negative for chest pain.  Gastrointestinal: Negative for abdominal pain and bowel incontinence.  Genitourinary: Negative for dysuria.  Musculoskeletal: Positive for back pain.  Neurological: Negative for weakness, numbness and headaches.   Remainder of Review of Systems negative for acute change except as noted in the HPI.  Allergies  Review of patient's allergies indicates no known allergies.  Home Medications   Prior to  Admission medications   Medication Sig Start Date End Date Taking? Authorizing Provider  desvenlafaxine (PRISTIQ) 50 MG 24 hr tablet Take 50 mg by mouth daily.   Yes Historical Provider, MD  LamoTRIgine (LAMICTAL XR) 250 MG TB24 Take 1 tablet by mouth daily.     Yes Historical Provider, MD  QUEtiapine (SEROQUEL XR) 200 MG 24 hr tablet Take 200 mg by mouth at bedtime.     Yes Historical Provider, MD  amphetamine-dextroamphetamine (ADDERALL XR) 20 MG 24 hr capsule Take 20 mg by mouth daily.    Historical Provider, MD  esomeprazole (NEXIUM) 40 MG capsule Take 1 capsule (40 mg total) by mouth daily. 10/27/14   Lajean Manes, MD  meloxicam (MOBIC) 7.5 MG tablet Take 1 twice a day as needed for pain. Take with food. (Do not take with any other NSAID.) 11/17/14   Lajean Manes, MD  ondansetron (ZOFRAN) 4 MG tablet Take 1 tablet (4 mg total) by mouth every 6 (six) hours. As needed for nausea 10/27/14   Lajean Manes, MD  predniSONE (DELTASONE) 50 MG tablet Take 1 tablet (50 mg total) by mouth daily. With food for 3 days. 11/17/14   Lajean Manes, MD  zolpidem (AMBIEN) 5 MG tablet Take 5 mg by mouth at bedtime as needed.      Historical Provider, MD   BP 122/88 mmHg  Pulse 91  Temp(Src) 98.6 F (37 C) (Oral)  Resp 14  Wt 186 lb (84.369 kg)  SpO2 96% Physical Exam  Constitutional: She is oriented to person, place, and time. She appears well-developed and well-nourished.  Non-toxic appearance. No distress (Uncomfortable from neck pain.).  HENT:  Head: Normocephalic and atraumatic. Head is without abrasion and without contusion.  Right Ear: External ear normal.  Left Ear: External ear normal.  Nose: Nose normal.  Mouth/Throat: Oropharynx is clear and moist.  Eyes: Conjunctivae are normal. Pupils are equal, round, and reactive to light. No scleral icterus.  Neck: Trachea normal. Neck supple. Normal carotid pulses present. No thyroid mass present.  Cardiovascular: Regular rhythm and normal heart sounds.    Pulmonary/Chest: Effort normal and breath sounds normal. No respiratory distress.  Musculoskeletal:       Left shoulder: Normal. She exhibits normal range of motion, no tenderness, no bony tenderness, no swelling and no deformity.       Cervical back: She exhibits decreased range of motion, tenderness and spasm (Posterior cervical muscles.). She exhibits no bony tenderness, no swelling, no edema, no deformity, no laceration and normal pulse.       Thoracic back: Normal.       Lumbar back: Normal.  No spinal tenderness deformity  Lymphadenopathy:       Head (right side): No occipital adenopathy present.       Head (left side): No occipital adenopathy present.    She has no cervical adenopathy.  Neurological: She is alert and oriented to person, place, and time. She has normal strength and normal reflexes. She displays no atrophy and no tremor. No cranial nerve deficit or sensory deficit. She exhibits normal muscle tone. Gait normal.  Reflex Scores:      Tricep reflexes are 2+ on the right side and 2+ on the left side.      Bicep reflexes are 2+ on the right side and 2+ on the left side.      Brachioradialis reflexes are 2+ on the right side and 2+ on the left side.      Patellar reflexes are 2+ on the right side and 2+ on the left side.      Achilles reflexes are 2+ on the right side and 2+ on the left side. Skin: Skin is warm, dry and intact. No lesion and no rash noted.  Psychiatric: She has a normal mood and affect.  Nursing note and vitals reviewed.   ED Course  Procedures (including critical care time) Labs Review Labs Reviewed - No data to display  Imaging Review No results found.   MDM   1. Cervical myofascial strain, initial encounter    No evidence of spinal or discogenic cause. After discussion with patient, we both agree that imaging not indicated at this time. Treatment options discussed, as well as risks, benefits, alternatives. Patient voiced understanding and  agreement with the following plans: Relative rest then gradually increase range of motion. Handout on neck exercises given. Other written and verbal information given. Follow-up with your primary care doctor or orthopedist in 5-7 days if not improving, or sooner if symptoms become worse. Precautions discussed. Red flags discussed. Questions invited and answered. Patient voiced understanding and agreement.    Lajean Manesavid Massey, MD 11/17/14 [phone]

## 2015-05-31 ENCOUNTER — Encounter: Payer: Self-pay | Admitting: Family Medicine

## 2015-05-31 ENCOUNTER — Ambulatory Visit (INDEPENDENT_AMBULATORY_CARE_PROVIDER_SITE_OTHER): Payer: 59 | Admitting: Family Medicine

## 2015-05-31 VITALS — BP 146/90 | HR 92 | Ht 69.0 in | Wt 190.0 lb

## 2015-05-31 DIAGNOSIS — M25562 Pain in left knee: Secondary | ICD-10-CM

## 2015-05-31 NOTE — Patient Instructions (Signed)
Your knee pain is consistent with arthritis, less likely a medial meniscus tear. Both are treated similarly initially. These are the classes of medicine you can use for this: Tylenol 500mg  1-2 tabs three times a day for pain. Ibuprofen 600mg  three times a day with food OR Aleve 2 tabs twice a day with food Glucosamine sulfate 750mg  twice a day is a supplement that may help. Capsaicin, aspercreme, or biofreeze topically up to four times a day may also help with pain. Cortisone injections are an option. It's important that you continue to stay active. Straight leg raises, straight leg raises with foot turned outwards, knee extensions 3 sets of 10 once a day (add ankle weight if these become too easy). Start physical therapy to strengthen muscles around the joint that hurts to take pressure off of the joint itself. Shoe inserts with good arch support may be helpful. Heat or ice 15 minutes at a time 3-4 times a day as needed to help with pain. Water aerobics and cycling with low resistance are the best two types of exercise for arthritis. Follow up with me in 6 weeks for reevaluation.

## 2015-06-02 ENCOUNTER — Ambulatory Visit: Payer: 59 | Admitting: Physical Therapy

## 2015-06-02 DIAGNOSIS — M25562 Pain in left knee: Secondary | ICD-10-CM | POA: Insufficient documentation

## 2015-06-02 NOTE — Progress Notes (Signed)
PCP: Court JoyIMBROOK-DILLOW,KAREN M, PA-C  Subjective:   HPI: Patient is a 49 y.o. female here for left knee pain.  Patient reports for about 2 weeks she's had pain anterior left knee. Was working on a tile floor kneeling and started to get pain here. Pain level has come down to 3/10 but gets up to 6/10, dull with stiffness. Worse with prolonged sitting also. + swelling. History of arthroscopic surgery this knee remotely. No skin changes, fever, other complaints.  Past Medical History  Diagnosis Date  . Bipolar affective disorder [facility](HCC)     Current Outpatient Prescriptions on File Prior to Visit  Medication Sig Dispense Refill  . amphetamine-dextroamphetamine (ADDERALL XR) 20 MG 24 hr capsule Take 20 mg by mouth daily.    Marland Kitchen. desvenlafaxine (PRISTIQ) 50 MG 24 hr tablet Take 50 mg by mouth daily.    Marland Kitchen. esomeprazole (NEXIUM) 40 MG capsule Take 1 capsule (40 mg total) by mouth daily. 21 capsule 0  . LamoTRIgine (LAMICTAL XR) 250 MG TB24 Take 1 tablet by mouth daily.      . meloxicam (MOBIC) 7.5 MG tablet Take 1 twice a day as needed for pain. Take with food. (Do not take with any other NSAID.) 20 tablet 0  . ondansetron (ZOFRAN) 4 MG tablet Take 1 tablet (4 mg total) by mouth every 6 (six) hours. As needed for nausea 6 tablet 0  . predniSONE (DELTASONE) 50 MG tablet Take 1 tablet (50 mg total) by mouth daily. With food for 3 days. 3 tablet 0  . QUEtiapine (SEROQUEL XR) 200 MG 24 hr tablet Take 200 mg by mouth at bedtime.      Marland Kitchen. zolpidem (AMBIEN) 5 MG tablet Take 5 mg by mouth at bedtime as needed.      . [DISCONTINUED] omeprazole (PRILOSEC) 20 MG capsule Take 20 mg by mouth daily.     No current facility-administered medications on file prior to visit.    Past Surgical History  Procedure Laterality Date  . Ablation      No Known Allergies  Social History   Social History  . Marital Status: Married    Spouse Name: N/A  . Number of Children: N/A  . Years of Education: N/A    Occupational History  . Not on file.   Social History Main Topics  . Smoking status: Never Smoker   . Smokeless tobacco: Not on file  . Alcohol Use: No  . Drug Use: No  . Sexual Activity: Not on file   Other Topics Concern  . Not on file   Social History Narrative    Family History  Problem Relation Age of Onset  . Hypertension Mother   . Heart attack Mother   . Diabetes Father   . Cancer Father     pancreatic    BP 146/90 mmHg  Pulse 92  Ht 5\' 9"  (1.753 m)  Wt 190 lb (86.183 kg)  BMI 28.05 kg/m2  Review of Systems: See HPI above.    Objective:  Physical Exam:  Gen: NAD  Left knee: No gross deformity, ecchymoses.  Minimal effusion. TTP medial joint line, post patellar facets.  No other tenderness. FROM. Negative ant/post drawers. Negative valgus/varus testing. Negative lachmanns. Negative mcmurrays, apleys, patellar apprehension. NV intact distally.  Right knee: FROM without pain.    Assessment & Plan:  1. Left knee pain - consistent with arthritis, less likely a medial meniscus tear.  Discussed both treated similarly.  Discussed tylenol, ibuprofen, glucosamine, topical medications.  Shown  home exercises and will start physical therapy also.  Heat/ice if needed.  F/u in 6 weeks.  Consider injection.

## 2015-06-02 NOTE — Assessment & Plan Note (Signed)
consistent with arthritis, less likely a medial meniscus tear.  Discussed both treated similarly.  Discussed tylenol, ibuprofen, glucosamine, topical medications.  Shown home exercises and will start physical therapy also.  Heat/ice if needed.  F/u in 6 weeks.  Consider injection.

## 2015-06-07 ENCOUNTER — Ambulatory Visit: Payer: 59 | Admitting: Rehabilitative and Restorative Service Providers"

## 2015-06-17 ENCOUNTER — Ambulatory Visit (INDEPENDENT_AMBULATORY_CARE_PROVIDER_SITE_OTHER): Payer: 59 | Admitting: Physical Therapy

## 2015-06-17 ENCOUNTER — Encounter: Payer: Self-pay | Admitting: Physical Therapy

## 2015-06-17 DIAGNOSIS — R531 Weakness: Secondary | ICD-10-CM

## 2015-06-17 DIAGNOSIS — M25562 Pain in left knee: Secondary | ICD-10-CM | POA: Diagnosis not present

## 2015-06-17 NOTE — Therapy (Signed)
[facility] [place] [address] [place], [place], [address] Phone: [phone]   Fax:  [phone]  Physical Therapy Evaluation  Patient Details  Name: Caitlyn Chase MRN: [id number] Date of Birth: [birth year] Referring Provider: Pearletha Forge  Encounter Date: 06/17/2015      PT End of Session - 06/17/15 1445    Visit Number 1   Number of Visits 8   Date for PT Re-Evaluation 07/15/15   PT Start Time 1403   PT Stop Time 1448   PT Time Calculation (min) 45 min   Activity Tolerance Patient tolerated treatment well      Past Medical History  Diagnosis Date  . Bipolar affective disorder [facility])     Past Surgical History  Procedure Laterality Date  . Ablation      There were no vitals filed for this visit.  Visit Diagnosis:  Knee pain, left - Plan: PT plan of care cert/re-cert  Weakness generalized - Plan: PT plan of care cert/re-cert      Subjective Assessment - 06/17/15 1407    Subjective Patient reports onset of Lt knee pain, has a h/p knee pain with kneeling. This episode she assisting with early voting and had to do a lot of kneeling to change out the tape and her knee swelled up to the ankle. Had some pain also. The pain was prgressing to interfere with her IADLs.  Was able to work through the pain however now she isn't.    How long can you sit comfortably? no limitations   Diagnostic tests none   Patient Stated Goals walk her dog without pain, be active with out pain   Currently in Pain? No/denies  has pain with transitioning to stand and with walk,  up to 6/10. Has tried ice with some relief.             [facility] PT Assessment - 06/17/15 0001    Assessment   Medical Diagnosis Lt knee pain   Referring Provider [place]   Onset Date/Surgical Date 05/25/15   Next MD Visit 07/14/15   Prior Therapy none   Precautions   Precautions None   Required Braces or Orthoses --  pt wondering if a brace would help her.    Balance Screen   Has the patient fallen in the past 6 months No   Has the patient had a decrease in activity level because of a fear of falling?  No   Is the patient reluctant to leave their home because of a fear of falling?  No   Home Environment   Living Environment Private residence   Home Layout One level   Additional Comments couple steps on deck no problem right now.    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    Observation/Other Assessments   Focus on Therapeutic Outcomes (FOTO)  59% limited   Functional Tests   Functional tests Squat;Single leg stance   Squat   Comments pain distal Lt knee   Single Leg Stance   Comments Rt 7 sec, Lt 12 sec.    Posture/Postural Control   Posture Comments WNL for lower body   ROM / Strength   AROM / PROM / Strength AROM;Strength   AROM   Overall AROM Comments --  knee flexion Rt 139, Lt 135 with pain at endrange.    Strength   Overall  Strength Comments rt ankle/knee/Lt ankle WNL, Lt knee grossly 4+/5   Strength Assessment Site Hip   Right/Left Hip Right;Left   Right Hip Flexion 5/5   Right Hip ABduction 4+/5   Left Hip Flexion 5/5   Left Hip ABduction 4+/5   Flexibility   Soft Tissue Assessment /Muscle Length yes   Hamstrings WNL   Quadriceps Rt 5" from buttocks, Lt 7.5"   Palpation   Patella mobility lateral tracking bilat.    Special Tests    Special Tests --  negative knee special tests                   [facility] Adult PT Treatment/Exercise - 06/17/15 0001    Exercises   Exercises Knee/Hip   Knee/Hip Exercises: Stretches   Quad Stretch 2 reps;Both;30 seconds  prone with strap   Knee/Hip Exercises: Supine   Straight Leg Raise with External Rotation Both;3 sets;10 reps   Knee/Hip Exercises: Sidelying   Hip ABduction Both;3 sets;Strengthening;10 reps  VC for form                PT  Education - 06/17/15 1434    Education provided Yes   Education Details HEP   Person(s) Educated Patient   Methods Explanation;Demonstration;Handout   Comprehension Verbalized understanding;Returned demonstration             PT Long Term Goals - 06/17/15 1832    PT LONG TERM GOAL #1   Title I with advanced HEP ( 07/15/15)   Time 4   Period Weeks   Status New   PT LONG TERM GOAL #2   Title report  minimal to no pain in Lt knee with transitioning sit to stand (07/15/15)    Time 4   Period Weeks   Status New   PT LONG TERM GOAL #3   Title ambulate with her dog without Lt knee pain (07/15/15)    Time 4   Period Weeks   Status New   PT LONG TERM GOAL #4   Title demo increased prone quad flexibility =/< 2" from buttocks (07/15/15)    Time 5   Period Weeks   Status New   PT LONG TERM GOAL #5   Title demo bilat hip abd and Lt knee strength =/> 5-/5 (07/15/15)    Time 4   Period Weeks   Status New   Additional Long Term Goals   Additional Long Term Goals Yes   PT LONG TERM GOAL #6   Title improve FOTO =/< 37% limited (07/15/15)    Time 4   Period Weeks   Status New               Plan - 06/17/15 1830    Clinical Impression Statement 49 yo female presents with h/o Lt knee pain after performing tasks in kneeling.  She has tight quads bilat, along with some LE weakness and lateral tracking of patellas.     Pt will benefit from skilled therapeutic intervention in order to improve on the following deficits Decreased strength;Pain;Impaired flexibility   Rehab Potential Excellent   PT Frequency 2x / week   PT Duration 4 weeks   PT Treatment/Interventions Manual techniques;Therapeutic exercise;Moist Heat;Iontophoresis /ml Dexamethasone;Electrical Stimulation;Cryotherapy;Dry needling;Passive range of motion;Patient/family education;Ultrasound;Taping   PT Next Visit Plan progress ther ex into standing, possible patellar taping   Consulted and Agree with Plan of Care  Patient         Problem List Patient Active Problem List  Diagnosis Date Noted  . Left knee pain 06/02/2015  . Obsessive-compulsive disorder 05/07/2013  . Bipolar affective disorder (HCC) 05/07/2013  . ADD (attention deficit disorder) 05/07/2013  . Cannot sleep 05/07/2013  . Left foot pain 11/30/2010  . DEPRESSION 06/19/2008    Roderic ScarceSusan Tylea Hise PT 06/17/2015, 6:39 PM  [facility] [address] [place], [place], [address] Phone: [phone]   Fax:  [phone]  Name: Urban GibsonJoanie M Marchese MRN: [id number] Date of Birth: 28-Mar-1966

## 2015-06-17 NOTE — Patient Instructions (Signed)
Straight Leg Raise: With External Leg Rotation  [place] [phone]    Lie on back with right leg straight, opposite leg bent. Rotate straight leg out and lift __10-12__ inches. Repeat __10__ times per set. Do __3__ sets per session. Do __1__ sessions per day. Repeat on the other leg.   Strengthening: Hip Abduction (Side-Lying)    Tighten muscles on front of left thigh, then lift leg _12-18___ inches from surface, keeping knee locked.  Repeat _10___ times per set. Do _3___ sets per session. Do __1__ sessions per day. Repeat on the other leg.    Self-Mobilization: Knee Flexion (Prone)    Bring left heel toward buttocks as close as possible, use a strap to pull heel closer until gentle stretch is felt on the front of the thigh. Hold __30-45__ seconds. Relax. Repeat __1__ times per set. Do __1__ sets per session. Do __1__ sessions per day. Repeat on the other leg.   Copyright  VHI. All rights reserved.

## 2015-06-21 ENCOUNTER — Ambulatory Visit (INDEPENDENT_AMBULATORY_CARE_PROVIDER_SITE_OTHER): Payer: 59 | Admitting: Rehabilitative and Restorative Service Providers"

## 2015-06-21 ENCOUNTER — Encounter: Payer: Self-pay | Admitting: Rehabilitative and Restorative Service Providers"

## 2015-06-21 DIAGNOSIS — M25562 Pain in left knee: Secondary | ICD-10-CM | POA: Diagnosis not present

## 2015-06-21 DIAGNOSIS — R531 Weakness: Secondary | ICD-10-CM

## 2015-06-21 NOTE — Patient Instructions (Signed)
  HIP: Hamstrings - Supine    Place strap around foot. Raise leg up, keep knee straight. Hold _30 seconds. 3 reps per set, _2-3sets per day  Outer Hip Stretch: Reclined IT Band Stretch (Strap)    Strap around opposite foot, pull across only as far as possible with shoulders on mat. Hold for _30 sec  Repeat __3__ times each leg.  Achilles / Gastroc, Standing    Stand, right foot behind, heel on floor and turned slightly out, leg straight, forward leg bent. Move hips forward. Hold _30__ seconds. Repeat _3__ times per session. Do _2-3__ sessions per day.   With Support    Stand on one leg in neutral spine holding support. Hold __30__ seconds. Repeat on other leg. Do __3__ repetitions, __2 x/day Lateral Step Up    Stand to side of step. Slowly touch heel down then straighten knee again. Perform _10__ reps. 2 sets 1-2 x/day      Trunk Flexion    Standing on one leg, bend forward from hips. Touch chair about knee height,  then return, keeping back straight and leg bent. Hold each position __2-3__ seconds. Repeat on other leg. Do __10__ repetitions, __2-3__ sets.

## 2015-06-21 NOTE — Therapy (Addendum)
[facility] [address] [place], [place], [address] Phone: [phone]   Fax:  [phone]  Physical Therapy Treatment  Patient Details  Name: Caitlyn Chase MRN: [id number] Date of Birth: [birth year] Referring Provider: Barbaraann Barthel  Encounter Date: 06/21/2015      PT End of Session - 06/21/15 1406    Visit Number 2   Number of Visits 8   Date for PT Re-Evaluation 07/15/15   PT Start Time 4034   PT Stop Time 1449   PT Time Calculation (min) 45 min   Activity Tolerance Patient tolerated treatment well      Past Medical History  Diagnosis Date  . Bipolar affective disorder [facility])     Past Surgical History  Procedure Laterality Date  . Ablation      There were no vitals filed for this visit.  Visit Diagnosis:  Knee pain, left  Weakness generalized      Subjective Assessment - 06/21/15 1410    Subjective Pt reports that she continues to have Lt knee pain especially with walking or kneeling. She tried a knee sleeve type brace when walking but it did nothing for her pain. Feels less pain with standing and walking when the tape is on for correcting patellar alignment.    Currently in Pain? Yes   Pain Score 4    Pain Location Knee   Pain Orientation Left   Pain Descriptors / Indicators Aching  stiffness    Pain Radiating Towards calf and posterior thigh and inside quad area    Aggravating Factors  standing; wlaking; ascending and descending stairs    Pain Relieving Factors rest; heat with leg straight                          [facility] Adult PT Treatment/Exercise - 06/21/15 0001    Posture/Postural Control   Posture Comments stands with knees hyperextended    Self-Care   Self-Care --  PTA taped Lt patella to correct lat tracking    Neuro Re-ed    Neuro Re-ed Details  worked on standing without hyperextending knees    Exercises   Exercises Knee/Hip   Knee/Hip Exercises: Stretches   Passive  Hamstring Stretch 3 reps;30 seconds   Gastroc Stretch 3 reps;30 seconds;Left;Right   Soleus Stretch 2 reps;30 seconds;Left;Right   Soleus Stretch Limitations min stretch noted - d/c stretch for now    Knee/Hip Exercises: Aerobic   Stationary Bike Nustep L4 x 5'   Knee/Hip Exercises: Standing   Lateral Step Up Limitations lateral heel tap 4" step 10 x2 sets Lt/Rt  (+) hand hold for balance    SLS 30 sec x 3 reps each LE intermittent UE support for balance   Other Standing Knee Exercises SLS fwd touch 5x2 sets each LE    Knee/Hip Exercises: Supine   Quad Sets Left;Strengthening;1 set;10 reps  10 sec hold    Straight Leg Raise with External Rotation Both;3 sets;10 reps   Moist Heat Therapy   Number Minutes Moist Heat 10 Minutes   Moist Heat Location Knee                PT Education - 06/21/15 1449    Education provided --   Education Details standing without hyperextensing knees; HEP; wlaking on sevel surfaces for now as opposed to up and down terrain   Person(s) Educated Patient   Methods Explanation;Demonstration;Tactile cues;Verbal cues;Handout   Comprehension Verbalized understanding;Returned demonstration;Verbal  cues required;Tactile cues required             PT Long Term Goals - 06/17/15 1832    PT LONG TERM GOAL #1   Title I with advanced HEP ( 07/15/15)   Time 4   Period Weeks   Status New   PT LONG TERM GOAL #2   Title report  minimal to no pain in Lt knee with transitioning sit to stand (07/15/15)    Time 4   Period Weeks   Status New   PT LONG TERM GOAL #3   Title ambulate with her dog without Lt knee pain (07/15/15)    Time 4   Period Weeks   Status New   PT LONG TERM GOAL #4   Title demo increased prone quad flexibility =/< 2" from buttocks (07/15/15)    Time 5   Period Weeks   Status New   PT LONG TERM GOAL #5   Title demo bilat hip abd and Lt knee strength =/> 5-/5 (07/15/15)    Time 4   Period Weeks   Status New   Additional Long Term  Goals   Additional Long Term Goals Yes   PT LONG TERM GOAL #6   Title improve FOTO =/< 37% limited (07/15/15)    Time 4   Period Weeks   Status New               Plan - 06/21/15 1630    Clinical Impression Statement Increased soreness with exercise but like she had worked the muscle. Knee sleeve did not help at all with walking. Pt responded very well to PTA taping knee for lateral patellar tracking with imediate improvement in pain with walking and exercise. Suggested pt avoid up/down unlevel terrain with walking for now. She progressed exercise program in clinic without difficulty. No goals accomplished - good response to treatment  today inc taping and exercise.    Pt will benefit from skilled therapeutic intervention in order to improve on the following deficits Decreased strength;Pain;Impaired flexibility   Rehab Potential Excellent   PT Frequency 2x / week   PT Duration 4 weeks   PT Treatment/Interventions Manual techniques;Therapeutic exercise;Moist Heat;Iontophoresis 88m/ml Dexamethasone;Electrical Stimulation;Cryotherapy;Dry needling;Passive range of motion;Patient/family education;Ultrasound;Taping   PT Next Visit Plan progress ther ex into standing, assess response to patellar taping   PT Home Exercise Plan HEP; walking on level surface; assess patella taping   Consulted and Agree with Plan of Care Patient        Problem List Patient Active Problem List   Diagnosis Date Noted  . Left knee pain 06/02/2015  . Obsessive-compulsive disorder 05/07/2013  . [place] 05/07/2013  . ADD (attention deficit disorder) 05/07/2013  . Cannot sleep 05/07/2013  . Left foot pain 11/30/2010  . DEPRESSION 06/19/2008    Celyn PNilda SimmerPT, MPH  06/21/2015, 4:35 PM  [facility] [address] [address]Phone: [phone]  Fax:  [phone] Name: JPRECIOUS SEGALLMRN: [id number]  of Birth: 112-May-1967   PHYSICAL THERAPY DISCHARGE SUMMARY  Visits from Start of Care: 2  Current functional level related to goals / functional outcomes: unknown   Remaining deficits: unknown   Education / Equipment: Initial HEP Plan:  Patient goals were not met. Patient is being discharged due to not returning since the last visit.  ?????    Jeral Pinch, PT 07/28/2015 2:48 PM

## 2015-06-23 ENCOUNTER — Encounter: Payer: 59 | Admitting: Physical Therapy

## 2015-06-28 ENCOUNTER — Encounter: Payer: 59 | Admitting: Physical Therapy

## 2015-06-30 ENCOUNTER — Encounter: Payer: 59 | Admitting: Physical Therapy

## 2015-07-05 ENCOUNTER — Encounter: Payer: 59 | Admitting: Physical Therapy

## 2015-07-07 ENCOUNTER — Encounter: Payer: 59 | Admitting: Physical Therapy

## 2015-07-09 ENCOUNTER — Ambulatory Visit: Payer: 59 | Admitting: Family Medicine

## 2015-07-14 ENCOUNTER — Encounter: Payer: 59 | Admitting: Physical Therapy

## 2015-07-16 ENCOUNTER — Encounter: Payer: 59 | Admitting: Physical Therapy

## 2015-10-24 ENCOUNTER — Emergency Department (INDEPENDENT_AMBULATORY_CARE_PROVIDER_SITE_OTHER)
Admission: EM | Admit: 2015-10-24 | Discharge: 2015-10-24 | Disposition: A | Discharge status: Home / Self Care | Payer: 59 | Source: Home / Self Care | Attending: Family Medicine | Admitting: Family Medicine

## 2015-10-24 DIAGNOSIS — Z23 Encounter for immunization: Secondary | ICD-10-CM

## 2015-10-24 DIAGNOSIS — L259 Unspecified contact dermatitis, unspecified cause: Secondary | ICD-10-CM

## 2015-10-24 DIAGNOSIS — S61012A Laceration without foreign body of left thumb without damage to nail, initial encounter: Secondary | ICD-10-CM

## 2015-10-24 MED ORDER — PREDNISONE 20 MG PO TABS: ORAL_TABLET | ORAL | Status: DC

## 2015-10-24 MED ORDER — HYDROXYZINE HCL 25 MG PO TABS: 25.0000 mg | ORAL_TABLET | Freq: Four times a day (QID) | ORAL | Status: AC

## 2015-10-24 MED ORDER — TETANUS-DIPHTH-ACELL PERTUSSIS 5-2.5-18.5 LF-MCG/0.5 IM SUSP
0.5000 mL | Freq: Once | INTRAMUSCULAR | Status: AC
  Administered 2015-10-24: 0.5 mL via INTRAMUSCULAR

## 2015-10-24 NOTE — ED Notes (Signed)
Bacitracin and bandaid applied to wound.

## 2015-10-24 NOTE — ED Provider Notes (Signed)
CSN: [id number]     Arrival date & time 10/24/15  1749 History   First MD Initiated Contact with Patient 10/24/15 1802     Chief Complaint  Patient presents with  . Laceration    left thumb   (Consider location/radiation/quality/duration/timing/severity/associated sxs/prior Treatment) HPI  The pt is a 50yo female presenting to [facility] with c/o cut to Left thumb around 5PM this evening while tending to her yard.  Pt reports it was a rusty tool and concerned she needs a tetanus vaccine booster as she recalls last one was about 8 years ago.  Cut is minor, minimal stinging pain, bleeding controlled PTA. Pt is left hand dominant.    Pt also c/o moderately to severely pruritic erythematous rash to her Left forearm she believes is from her cats bringing in debris from plans in her backyard. Rash started about 3-4 days ago, only on her Left arm.  She has hx of similar rashes from poison ivy in the past and has had prednisone in the past and has done well. She has been taking benadryl and using calamine lotion with mild to moderate relief but wondering if there is anything else she can try to help with itching. Denies SOB, wheeze, n/v/d.   Past Medical History  Diagnosis Date  . Bipolar affective disorder [facility])    Past Surgical History  Procedure Laterality Date  . Ablation     Family History  Problem Relation Age of Onset  . Hypertension Mother   . Heart attack Mother   . Diabetes Father   . Cancer Father     pancreatic   Social History  Substance Use Topics  . Smoking status: Never Smoker   . Smokeless tobacco: None  . Alcohol Use: No   OB History    No data available     Review of Systems  Constitutional: Negative for fever and chills.  Gastrointestinal: Negative for nausea, vomiting and diarrhea.  Musculoskeletal: Negative for myalgias and back pain.  Skin: Positive for rash and wound. Negative for color change and pallor.    Allergies  Review of patient's allergies indicates no  known allergies.  Home Medications   Prior to Admission medications   Medication Sig Start Date End Date Taking? Authorizing Provider  amphetamine-dextroamphetamine (ADDERALL XR) 20 MG 24 hr capsule Take 20 mg by mouth daily.    Historical Provider, MD  desvenlafaxine (PRISTIQ) 50 MG 24 hr tablet Take 50 mg by mouth daily.    Historical Provider, MD  esomeprazole (NEXIUM) 40 MG capsule Take 1 capsule (40 mg total) by mouth daily. 10/27/14   Lajean Manes, MD  hydrOXYzine (ATARAX/VISTARIL) 25 MG tablet Take 1 tablet (25 mg total) by mouth every 6 (six) hours. For itching 10/24/15   Junius Finner, PA-C  LamoTRIgine (LAMICTAL XR) 250 MG TB24 Take 1 tablet by mouth 2 (two) times daily.     Historical Provider, MD  meloxicam (MOBIC) 7.5 MG tablet Take 1 twice a day as needed for pain. Take with food. (Do not take with any other NSAID.) Patient not taking: Reported on 06/17/2015 11/17/14   Lajean Manes, MD  ondansetron (ZOFRAN) 4 MG tablet Take 1 tablet (4 mg total) by mouth every 6 (six) hours. As needed for nausea Patient not taking: Reported on 06/17/2015 10/27/14   Lajean Manes, MD  predniSONE (DELTASONE) 20 MG tablet 3 tabs po day one, then 2 po daily x 4 days 10/24/15   Junius Finner, PA-C  predniSONE (DELTASONE) 50 MG tablet  Take 1 tablet (50 mg total) by mouth daily. With food for 3 days. 11/17/14   Lajean Manesavid Massey, MD  QUEtiapine (SEROQUEL XR) 200 MG 24 hr tablet Take 200 mg by mouth 2 (two) times daily.     Historical Provider, MD  zolpidem (AMBIEN) 5 MG tablet Take 5 mg by mouth at bedtime as needed.      Historical Provider, MD   Meds Ordered and Administered this Visit   Medications  Tdap (BOOSTRIX) injection 0.5 mL (0.5 mLs Intramuscular Given 10/24/15 1810)    BP 134/85 mmHg  Pulse 96  Temp(Src) 98.3 F (36.8 C) (Oral)  Ht 5\' 9"  (1.753 m)  Wt 203 lb (92.08 kg)  BMI 29.96 kg/m2  SpO2 96% No data found.   Physical Exam  Constitutional: She is oriented to person, place, and time. She  appears well-developed and well-nourished.  HENT:  Head: Normocephalic and atraumatic.  Eyes: EOM are normal.  Neck: Normal range of motion.  Cardiovascular: Normal rate.   Pulmonary/Chest: Effort normal.  Musculoskeletal: Normal range of motion. She exhibits no edema or tenderness.  Neurological: She is alert and oriented to person, place, and time.  Skin: Skin is warm and dry. Rash noted. There is erythema.     Left thumb: 2cm superficial laceration with scant dried blood. No active bleeding. No foreign bodies seen or palpated.   Left forearm: diffuse erythematous vesicular rash with small bullae in patches. No active bleeding or drainage.   Psychiatric: She has a normal mood and affect. Her behavior is normal.  Nursing note and vitals reviewed.   ED Course  Procedures (including critical care time)  Labs Review Labs Reviewed - No data to display  Imaging Review No results found.    MDM   1. Thumb laceration, left, initial encounter   2. Need for tetanus booster   3. Contact dermatitis    [facility] after cutting her Left thumb on a rusty garden took about 1 hour PTA.  Pt also c/o pruritic rash to Left forearm.  Wound closure not indicated. Wound cleaned and bacitracin and bandage applied. Tdap updated today in UC  Rash c/w contact dermatitis. Home care instructions provided. Rx: Prednisone and atarax.  F[facility]u with PCP [facility] for recheck of symptoms if not improving in 1 week, sooner if worsening. Patient verbalized understanding and agreement with treatment plan.     Junius Finnerrin O'Malley, PA-C 10/24/15 1818

## 2015-10-24 NOTE — ED Notes (Signed)
Cut finger on rusty tool around 5 pm today.  Also has dermatitis on left forearm.

## 2017-01-15 ENCOUNTER — Encounter: Payer: Self-pay | Admitting: Family Medicine

## 2017-01-15 ENCOUNTER — Ambulatory Visit (HOSPITAL_BASED_OUTPATIENT_CLINIC_OR_DEPARTMENT_OTHER)
Admission: RE | Admit: 2017-01-15 | Discharge: 2017-01-15 | Disposition: A | Discharge status: Home / Self Care | Payer: BLUE CROSS/BLUE SHIELD | Source: Ambulatory Visit | Attending: Family Medicine | Admitting: Family Medicine

## 2017-01-15 ENCOUNTER — Ambulatory Visit (INDEPENDENT_AMBULATORY_CARE_PROVIDER_SITE_OTHER): Payer: BLUE CROSS/BLUE SHIELD | Admitting: Family Medicine

## 2017-01-15 VITALS — BP 136/85 | HR 82 | Ht 69.0 in | Wt 205.0 lb

## 2017-01-15 DIAGNOSIS — M7732 Calcaneal spur, left foot: Secondary | ICD-10-CM | POA: Insufficient documentation

## 2017-01-15 DIAGNOSIS — W228XXA Striking against or struck by other objects, initial encounter: Secondary | ICD-10-CM | POA: Insufficient documentation

## 2017-01-15 DIAGNOSIS — S99922A Unspecified injury of left foot, initial encounter: Secondary | ICD-10-CM | POA: Diagnosis not present

## 2017-01-15 DIAGNOSIS — M7989 Other specified soft tissue disorders: Secondary | ICD-10-CM | POA: Diagnosis not present

## 2017-01-15 MED ORDER — AMOXICILLIN-POT CLAVULANATE 875-125 MG PO TABS: 1.0000 | ORAL_TABLET | Freq: Two times a day (BID) | ORAL | 0 refills | Status: AC

## 2017-01-15 NOTE — Assessment & Plan Note (Signed)
independently reviewed radiographs and no evidence fracture.  2/2 contusion and laceration.  Postop shoe or cam boot for comfort and switch to comfortable shoe when tolerated.  Cover laceration with ointment and nonstick gauze.  Given this was a dog 'bite' caused by accidentally striking dog's mouth with this area of foot will give abx prophylaxis with augmentin twice a day for 7 days.  F/u in 2-4 weeks if not improving as expected.

## 2017-01-15 NOTE — Progress Notes (Signed)
PCP: Court Joyimbrook-Dillow, Karen M, PA-C  Subjective:   HPI: Caitlyn Chase is a 51 y.o. female here for left foot injury.  Caitlyn Chase reports she was playing soccer with her dog last night. She went to kick the ball and accidentally hit dogs' teeth with lateral left foot. Caused a cut between 4th and 5th digits. Majority of her pain is distally lateral foot. Pain level is 9/10, sharp. Difficulty walking. Associated bruising and swelling locally. No other skin changes. No numbness. No prior injuries.  Past Medical History:  Diagnosis Date  . Bipolar affective disorder [facility](HCC)     Current Outpatient Prescriptions on File Prior to Visit  Medication Sig Dispense Refill  . desvenlafaxine (PRISTIQ) 50 MG 24 hr tablet Take 50 mg by mouth daily.    Marland Kitchen. esomeprazole (NEXIUM) 40 MG capsule Take 1 capsule (40 mg total) by mouth daily. 21 capsule 0  . hydrOXYzine (ATARAX/VISTARIL) 25 MG tablet Take 1 tablet (25 mg total) by mouth every 6 (six) hours. For itching 12 tablet 0  . LamoTRIgine (LAMICTAL XR) 250 MG TB24 Take 1 tablet by mouth 2 (two) times daily.     . meloxicam (MOBIC) 7.5 MG tablet Take 1 twice a day as needed for pain. Take with food. (Do not take with any other NSAID.) (Caitlyn Chase not taking: Reported on 06/17/2015) 20 tablet 0  . QUEtiapine (SEROQUEL XR) 200 MG 24 hr tablet Take 200 mg by mouth 2 (two) times daily.     Marland Kitchen. zolpidem (AMBIEN) 5 MG tablet Take 5 mg by mouth at bedtime as needed.      . [DISCONTINUED] omeprazole (PRILOSEC) 20 MG capsule Take 20 mg by mouth daily.     No current facility-administered medications on file prior to visit.     Past Surgical History:  Procedure Laterality Date  . ABLATION      No Known Allergies  Social History   Social History  . Marital status: Married    Spouse name: N/A  . Number of children: N/A  . Years of education: N/A   Occupational History  . Not on file.   Social History Main Topics  . Smoking status: Never Smoker  . Smokeless  tobacco: Never Used  . Alcohol use No  . Drug use: No  . Sexual activity: Not on file   Other Topics Concern  . Not on file   Social History Narrative  . No narrative on file    Family History  Problem Relation Age of Onset  . Hypertension Mother   . Heart attack Mother   . Diabetes Father   . Cancer Father        pancreatic    BP 136/85   Pulse 82   Ht 5\' 9"  (1.753 m)   Wt 205 lb (93 kg)   BMI 30.27 kg/m   Review of Systems: See HPI above.     Objective:  Physical Exam:  Gen: NAD, comfortable in exam room  Left foot/ankle: Superficial laceration between 4th, 5th digits without drainage, purulence. FROM ankle without pain. TTP distal 4th, 5th metatarsals and 4th and 5th digits. Negative syndesmotic compression. Pain with metatarsal squeeze. Thompsons test negative. NV intact distally.  Right foot/ankle: FROM without pain.   Assessment & Plan:  1. Left foot injury - independently reviewed radiographs and no evidence fracture.  2/2 contusion and laceration.  Postop shoe or cam boot for comfort and switch to comfortable shoe when tolerated.  Cover laceration with ointment and nonstick gauze.  Given  this was a dog 'bite' caused by accidentally striking dog's mouth with this area of foot will give abx prophylaxis with augmentin twice a day for 7 days.  F/u in 2-4 weeks if not improving as expected.

## 2017-01-15 NOTE — Patient Instructions (Signed)
Your x-rays look great. You have a severe contusion of your metatarsals. Ice as needed 15 minutes at a time 3-4 times a day. Elevate above your heart as needed for swelling. Cover the wounded area with antibiotic ointment and nonstick gauze or a band-aid until it crusts over. Take augmentin twice a day for 7 days to prevent infection. Postop shoe or short cam walker for comfort - switch to supportive shoe when tolerated. Expect this to take 2-4 weeks to resolve. Follow up with me if you're not improving as expected.

## 2017-11-10 IMAGING — DX DG FOOT COMPLETE 3+V*L*
3 series · 3 of 3 positions shown · non-contrast
Comparison: November 29, 2010

CLINICAL DATA: Laceration laterally 1 day prior

EXAM:
LEFT FOOT - COMPLETE 3+ VIEW

[foot ap]
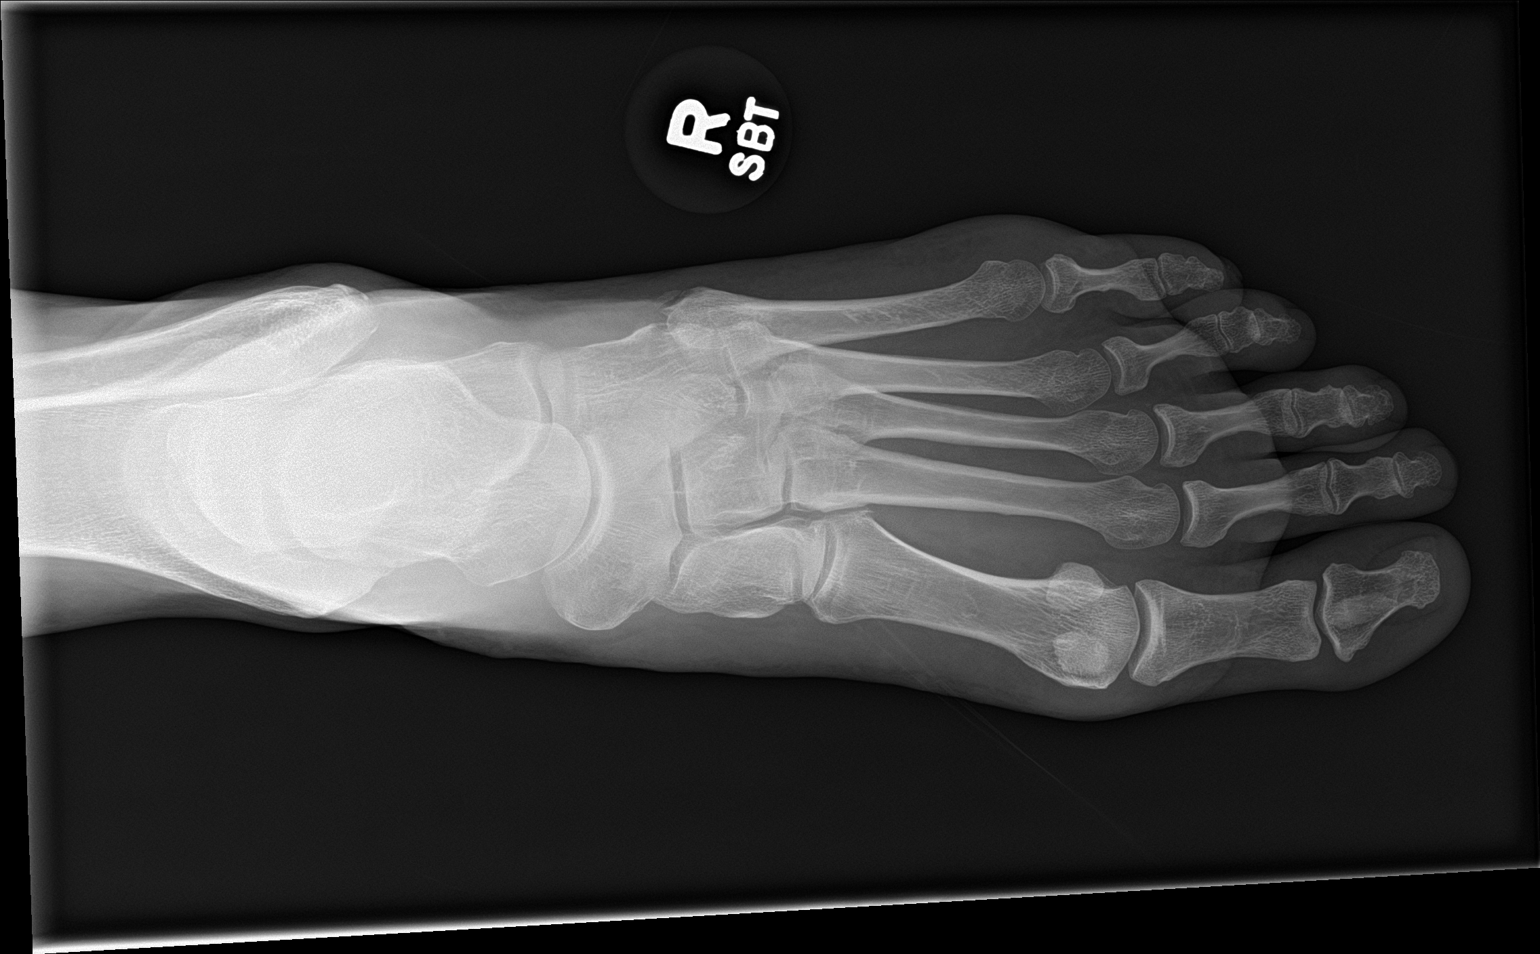

[foot obl]
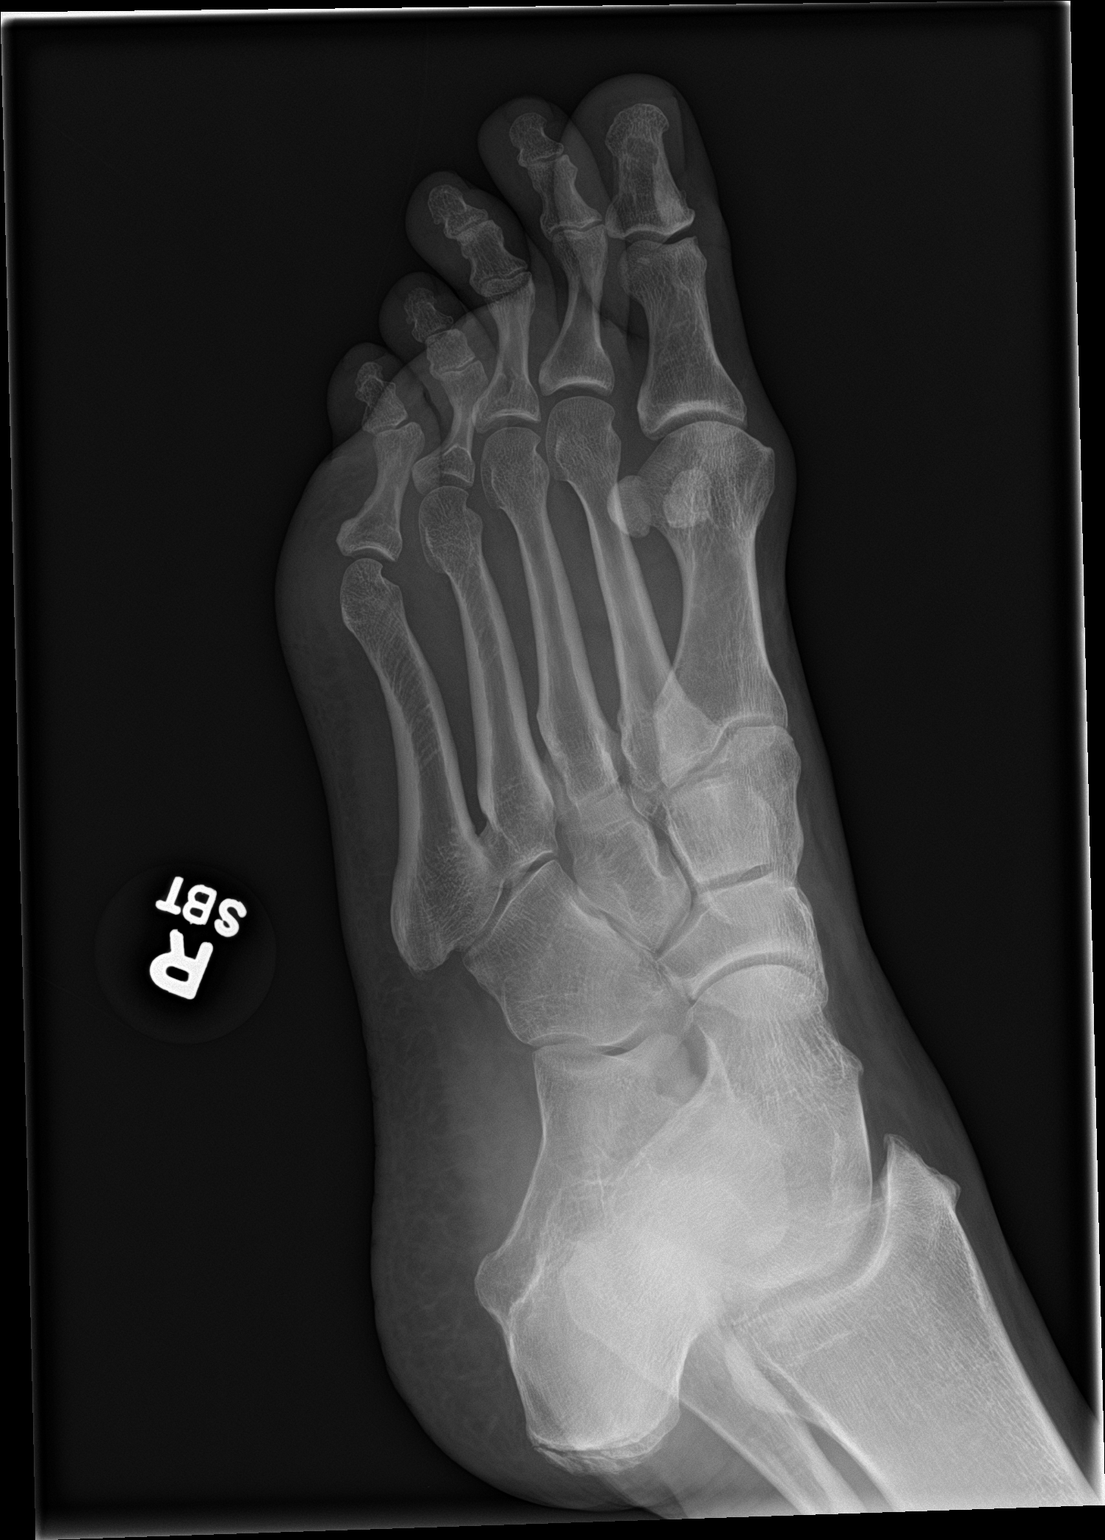

[foot lat]
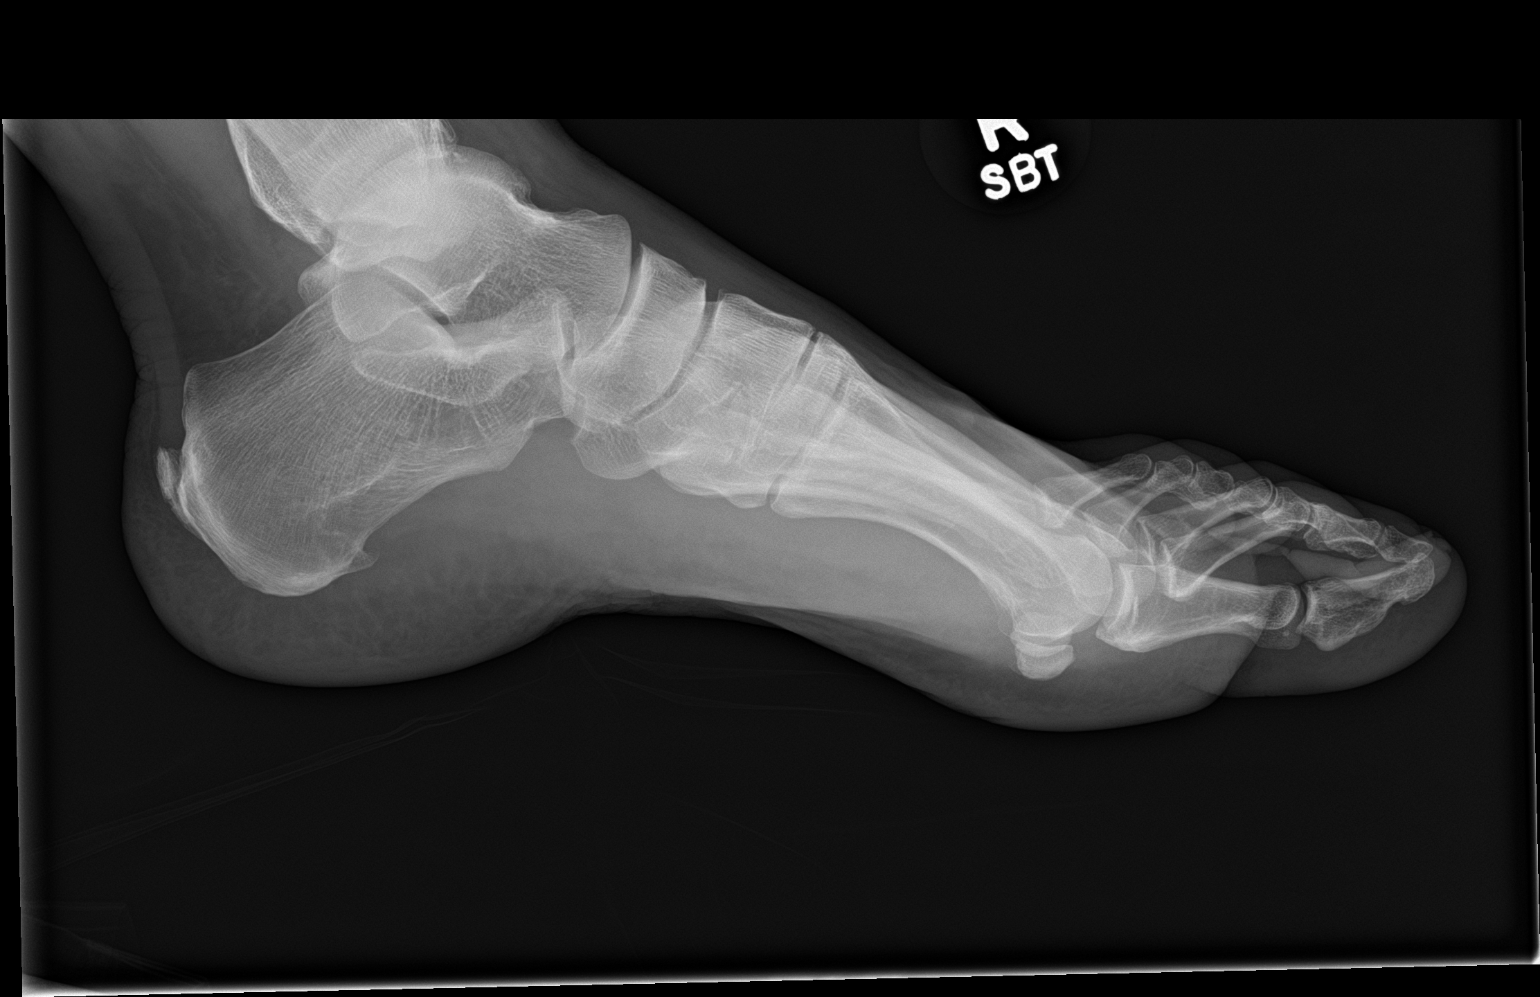

[3 of 3 positions shown; findings below may reference images not displayed]

FINDINGS: Frontal, oblique, and lateral views were obtained. There is soft
tissue swelling lateral to the fifth MTP joint. No soft tissue air
or radiopaque foreign body. No fracture or dislocation. Joint spaces
appear unremarkable. There are spurs arising from the posterior and
inferior calcaneus. There is a bone island in the first distal
phalanx.
IMPRESSION: Soft tissue swelling laterally without soft tissue air or radiopaque
foreign body. No fracture or dislocation. No appreciable
arthropathy. Stable bone island first distal phalanx. Calcaneal
spurs noted.
